# Patient Record
Sex: Female | Born: 1965 | Race: Black or African American | Hispanic: No | Marital: Single | Smoking: Never smoker
Health system: Southern US, Community
[De-identification: ages and names within clinical notes are randomized; demographics above are authoritative.]

## PROBLEM LIST (undated history)

## (undated) DIAGNOSIS — M4800 Spinal stenosis, site unspecified: Secondary | ICD-10-CM

## (undated) HISTORY — PX: REPLACEMENT TOTAL KNEE: SUR1224

## (undated) HISTORY — PX: ABDOMINAL HYSTERECTOMY: SHX81

## (undated) HISTORY — DX: Spinal stenosis, site unspecified: M48.00

---

## 1998-02-09 ENCOUNTER — Encounter: Admission: RE | Admit: 1998-02-09 | Discharge: 1998-02-09 | Payer: Self-pay | Admitting: *Deleted

## 1999-09-05 ENCOUNTER — Other Ambulatory Visit: Admission: RE | Admit: 1999-09-05 | Discharge: 1999-09-05 | Payer: Self-pay | Admitting: *Deleted

## 2002-01-29 ENCOUNTER — Other Ambulatory Visit: Admission: RE | Admit: 2002-01-29 | Discharge: 2002-01-29 | Payer: Self-pay | Admitting: Obstetrics and Gynecology

## 2002-01-30 ENCOUNTER — Encounter: Payer: Self-pay | Admitting: Obstetrics and Gynecology

## 2002-01-30 ENCOUNTER — Encounter: Admission: RE | Admit: 2002-01-30 | Discharge: 2002-01-30 | Payer: Self-pay | Admitting: Obstetrics and Gynecology

## 2002-06-25 ENCOUNTER — Emergency Department (HOSPITAL_COMMUNITY): Admission: EM | Admit: 2002-06-25 | Discharge: 2002-06-25 | Payer: Self-pay | Admitting: Emergency Medicine

## 2002-10-10 ENCOUNTER — Other Ambulatory Visit: Admission: RE | Admit: 2002-10-10 | Discharge: 2002-10-10 | Payer: Self-pay | Admitting: Family Medicine

## 2002-10-20 ENCOUNTER — Other Ambulatory Visit: Admission: RE | Admit: 2002-10-20 | Discharge: 2002-10-20 | Payer: Self-pay | Admitting: Obstetrics

## 2002-12-08 ENCOUNTER — Inpatient Hospital Stay (HOSPITAL_COMMUNITY): Admission: AD | Admit: 2002-12-08 | Discharge: 2002-12-11 | Payer: Self-pay | Admitting: Obstetrics

## 2002-12-08 ENCOUNTER — Encounter (INDEPENDENT_AMBULATORY_CARE_PROVIDER_SITE_OTHER): Payer: Self-pay

## 2003-11-18 ENCOUNTER — Emergency Department (HOSPITAL_COMMUNITY): Admission: EM | Admit: 2003-11-18 | Discharge: 2003-11-18 | Payer: Self-pay | Admitting: Emergency Medicine

## 2004-08-06 ENCOUNTER — Encounter: Admission: RE | Admit: 2004-08-06 | Discharge: 2004-08-06 | Payer: Self-pay | Admitting: Orthopedic Surgery

## 2004-09-14 ENCOUNTER — Other Ambulatory Visit: Admission: RE | Admit: 2004-09-14 | Discharge: 2004-09-14 | Payer: Self-pay | Admitting: Family Medicine

## 2006-12-13 ENCOUNTER — Emergency Department (HOSPITAL_COMMUNITY): Admission: EM | Admit: 2006-12-13 | Discharge: 2006-12-13 | Payer: Self-pay | Admitting: Emergency Medicine

## 2007-04-25 ENCOUNTER — Other Ambulatory Visit: Admission: RE | Admit: 2007-04-25 | Discharge: 2007-04-25 | Payer: Self-pay | Admitting: Obstetrics and Gynecology

## 2007-10-28 ENCOUNTER — Emergency Department (HOSPITAL_BASED_OUTPATIENT_CLINIC_OR_DEPARTMENT_OTHER): Admission: EM | Admit: 2007-10-28 | Discharge: 2007-10-28 | Payer: Self-pay | Admitting: Emergency Medicine

## 2008-03-27 HISTORY — PX: SHOULDER SURGERY: SHX246

## 2008-07-02 ENCOUNTER — Encounter: Admission: RE | Admit: 2008-07-02 | Discharge: 2008-07-02 | Payer: Self-pay | Admitting: Internal Medicine

## 2009-02-11 ENCOUNTER — Ambulatory Visit (HOSPITAL_BASED_OUTPATIENT_CLINIC_OR_DEPARTMENT_OTHER): Admission: RE | Admit: 2009-02-11 | Discharge: 2009-02-11 | Payer: Self-pay | Admitting: Orthopedic Surgery

## 2010-01-24 ENCOUNTER — Ambulatory Visit (HOSPITAL_COMMUNITY): Admission: RE | Admit: 2010-01-24 | Discharge: 2010-01-24 | Payer: Self-pay | Admitting: Internal Medicine

## 2010-02-21 ENCOUNTER — Encounter: Admission: RE | Admit: 2010-02-21 | Discharge: 2010-02-21 | Payer: Self-pay | Admitting: Internal Medicine

## 2010-04-17 ENCOUNTER — Encounter: Payer: Self-pay | Admitting: Orthopedic Surgery

## 2010-04-17 ENCOUNTER — Encounter: Payer: Self-pay | Admitting: Internal Medicine

## 2010-06-09 ENCOUNTER — Other Ambulatory Visit: Payer: Self-pay | Admitting: Interventional Radiology

## 2010-06-09 DIAGNOSIS — M79606 Pain in leg, unspecified: Secondary | ICD-10-CM

## 2010-06-22 ENCOUNTER — Other Ambulatory Visit: Payer: Self-pay

## 2010-06-28 ENCOUNTER — Emergency Department (INDEPENDENT_AMBULATORY_CARE_PROVIDER_SITE_OTHER): Payer: Managed Care, Other (non HMO)

## 2010-06-28 ENCOUNTER — Emergency Department (HOSPITAL_BASED_OUTPATIENT_CLINIC_OR_DEPARTMENT_OTHER)
Admission: EM | Admit: 2010-06-28 | Discharge: 2010-06-28 | Disposition: A | Discharge status: Home / Self Care | Payer: Managed Care, Other (non HMO) | Attending: Emergency Medicine | Admitting: Emergency Medicine

## 2010-06-28 DIAGNOSIS — M79609 Pain in unspecified limb: Secondary | ICD-10-CM

## 2010-06-28 DIAGNOSIS — M1610 Unilateral primary osteoarthritis, unspecified hip: Secondary | ICD-10-CM | POA: Insufficient documentation

## 2010-06-28 DIAGNOSIS — M169 Osteoarthritis of hip, unspecified: Secondary | ICD-10-CM

## 2010-06-28 DIAGNOSIS — Y93B9 Activity, other involving muscle strengthening exercises: Secondary | ICD-10-CM

## 2010-06-28 DIAGNOSIS — M1710 Unilateral primary osteoarthritis, unspecified knee: Secondary | ICD-10-CM

## 2010-06-28 DIAGNOSIS — G8929 Other chronic pain: Secondary | ICD-10-CM | POA: Insufficient documentation

## 2010-06-28 DIAGNOSIS — M25579 Pain in unspecified ankle and joints of unspecified foot: Secondary | ICD-10-CM

## 2010-06-28 DIAGNOSIS — M25569 Pain in unspecified knee: Secondary | ICD-10-CM

## 2010-06-28 DIAGNOSIS — M25559 Pain in unspecified hip: Secondary | ICD-10-CM

## 2010-06-28 LAB — D-DIMER, QUANTITATIVE: D-Dimer, Quant: <0.22 ug/mL-FEU (ref 0.00–0.48)

## 2010-08-12 NOTE — Discharge Summary (Signed)
NAME:  Gabriella Larson, Gabriella Larson                           ACCOUNT NO.:  [id number]   MEDICAL RECORD NO.:  [id number]                   PATIENT TYPE:  INP   LOCATION:  9143                                 FACILITY:  [facility]   PHYSICIAN:  Roseanna Rainbow, M.D.         DATE OF BIRTH:  12-27-1965   DATE OF ADMISSION:  12/08/2002  DATE OF DISCHARGE:  12/11/2002                                 DISCHARGE SUMMARY   CHIEF COMPLAINT:  The patient is a 45 year old gravida 3, para 2 with  symptomatic uterine fibroids who presents for total abdominal hysterectomy.   HISTORY OF PRESENT ILLNESS:  The patient reports worsening dysmenorrhea and  menorrhagia.   ALLERGIES:  No known drug allergies.   MEDICATIONS:  1. Advil.  2. Flagyl.   PAST SURGICAL HISTORY:  1. Herniorrhaphy.  2. Cesarean delivery with BTL.   ILLNESS/PAST MEDICAL HISTORY:  She denies.   FAMILY HISTORY:  Remarkable for breast cancer, ovarian cancer, cervical  cancer, diabetes mellitus.   PHYSICAL EXAMINATION:  VITAL SIGNS:  Temperature 99.1, pulse 76, blood  pressure 122/76, weight 193 pounds, height 5 feet 6 inches.  GENERAL APPEARANCE:  Well-nourished, well-developed African-American female  in no apparent distress.  HEAD/EYES/EARS/NOSE AND THROAT:  Normocephalic, atraumatic.  NECK:  Supple.  LUNGS:  Clear to auscultation bilaterally.  HEART:  Regular rate and rhythm.  BREASTS:  Soft, nontender.  No masses.  ABDOMEN:  Obese, soft, nontender.  No masses felt.  ADENOPATHY:  None.  GENITALIA:  On pelvic examination external female genitalia normal  appearing.  Speculum exam of vagina is clean.  Cervix is parous without  lesions noted.  On bimanual exam the uterus is approximately 16 weeks  aggregate size, irregular contour, tender.  The adnexa are nonpalpable.  EXTREMITIES:  No clubbing, cyanosis, or edema.   ASSESSMENT:  Symptomatic uterine fibroids.   PLAN:  Total abdominal hysterectomy.   HOSPITAL COURSE:  The patient  was admitted and underwent the total abdominal  hysterectomy and bilateral salpingectomy.  Please see the dictated operative  summary for further details.  Her postoperative course was remarkable for  postop day #1 hemoglobin of 9.2, she had a fever with a T-max of 102 within  the first 24 hours; that was unsustained.  She was discharged to home on  postoperative day #3 afebrile and tolerating a regular diet.   DISCHARGE DIAGNOSIS:  Symptomatic uterine fibroids.   PROCEDURE:  Total abdominal hysterectomy.   CONDITION:  Stable.   DIET:  Regular.   ACTIVITY:  No strenuous activity.  No intercourse.   MEDICATIONS INCLUDED:  Percocet one to two tabs every 6 hours as needed,  multivitamin once a day, ferrous sulfate one tab once a day, and the patient  was counseled she could take her own ibuprofen.   DISPOSITION:  She was to follow up in the office in 1 week.  Roseanna Rainbow, M.D.    Judee Clara  D:  12/31/2002  T:  12/31/2002  Job:  [id number]

## 2010-08-12 NOTE — Op Note (Signed)
NAME:  Gabriella Larson, Gabriella Larson                           ACCOUNT NO.:  [id number]   MEDICAL RECORD NO.:  [id number]                   PATIENT TYPE:  INP   LOCATION:  9199                                 FACILITY:  [facility]   PHYSICIAN:  Charles A. Clearance Coots, M.D.             DATE OF BIRTH:  29-Mar-1965   DATE OF PROCEDURE:  12/08/2002  DATE OF DISCHARGE:                                 OPERATIVE REPORT   PREOPERATIVE DIAGNOSIS:  Symptomatic uterine fibroids.   POSTOPERATIVE DIAGNOSIS:  Symptomatic uterine fibroids.   PROCEDURE:  Total abdominal hysterectomy, bilateral salpingectomy.   SURGEONS:  Charles A. Clearance Coots, M.D., Roseanna Rainbow, M.D.   ANESTHESIA:  General.   ESTIMATED BLOOD LOSS:  150 mL.   COMPLICATIONS:  None.   Foley to gravity.   SPECIMENS:  Uterus with cervix and two fallopian tubes.   FINDINGS:  Multiple uterine fibroids.   OPERATION:  The patient was brought to the operating room and after  satisfactory general anesthesia, the abdomen was prepped and draped in the  usual sterile fashion.  A Pfannenstiel skin incision was made with a scalpel  that was deepened down to the fascia with the scalpel.  The fascia was  nicked in the midline and the fascial incision was extended to the left and  to the right with curved Mayo scissors.  The superior and inferior fascial  edges were taken off of the rectus muscles with both blunt and sharp  dissection.  The rectus muscle was then bluntly and sharply divided in the  midline superiorly and inferiorly.  The peritoneum was entered digitally.  It was sharply incised both superiorly and inferiorly, being careful to  avoid the urinary bladder inferiorly.  A self-retaining O'Connor-O'Sullivan  retractor was then placed in the incision.  The bowel was packed off and the  anterior and posterior blades were applied.  The long Kelly forceps were  then placed across the utero-ovarian and broad ligaments.  The round  ligaments were then  transected bilaterally with the Bovie and the anterior  visceral peritoneum, the bladder reflection was then transected off of the  cervix and was pushed down and away from the lower uterine segment and  cervix until the shiny white tissue could be visualized, representing the  cervix and upper vagina.  The defect was then made digitally in the broad  ligament medial and close to the lateral wall of the uterus.  Parametrial  clamps were then placed across the utero-ovarian and broad ligaments in a  double clamp fashion and the utero-ovarian and broad ligaments were then  transected and suture ligated bilaterally with transfixion sutures of 0  Vicryl.  The uterine vessels were then skeletonized bilaterally, transected,  and suture ligated with transfixion suture of 0 Vicryl bilaterally.  Hemostasis was excellent.  The cardinal ligaments were then bilaterally  clamped, transected, and suture ligated with transfixion sutures of 0 Vicryl  down to the uterosacral ligaments.  The uterosacral ligaments were then  clamped bilaterally, transected, and suture ligated with transfixion sutures  of 0 Vicryl.  The parametrial clamps were then placed across the vagina  bilaterally and the cervix along with the uterus was then excised and  submitted to pathology for evaluation.  Corner sutures were placed beneath  the clamp, transfixion sutures of 0 Vicryl, and the remainder of the vaginal  cuff was closed with interrupted sutures of 0 Vicryl.  Hemostasis was  excellent.  The pelvic cavity was thoroughly irrigated with warm saline  solution and all clots were removed.  The pedicles were again observed for  hemostasis, and there was no active bleeding noted.  The fallopian tubes  were then clamped at the proximal end, the isthmic area of the tube, and the  remainder of the fallopian tube was transected and submitted to pathology  for evaluation.  A free tie of 0 Vicryl was placed beneath the Kelly clamp.   The same procedure was performed on the opposite side without complications.  The pelvic cavity was then again thoroughly lavaged with warm saline  solution, and there was no active bleeding noted from any pedicles or  closure of the vaginal cuff.  The self-retaining retractor was then removed  and all packing was removed.  The surgical technician indicated that all  needle, sponge, and instrument counts were correct.  The abdomen was then  closed as follows:  Fascia was closed with a continuous suture of 0 PDS from  each corner to the center, the subcutaneous tissue was thoroughly irrigated  with warm saline solution.  All areas of subcutaneous bleeding were  coagulated with the Bovie.  The skin was then closed with a continuous  subcuticular suture of 3-0 Monocryl.  Sterile bandage was applied to the  incision closure.  The surgical technician indicated that all needle,  sponge, and instrument counts were correct.  The patient tolerated the  procedure well, was transported to the recovery room in satisfactory  condition.                                               Charles A. Clearance Coots, M.D.    CAH/MEDQ  D:  12/08/2002  T:  12/08/2002  Job:  [id number]

## 2010-12-23 LAB — DIFFERENTIAL
Basophils Relative: 0
Eosinophils Absolute: 0
Eosinophils Relative: 1
Lymphocytes Relative: 30
Monocytes Absolute: 0.5
Neutrophils Relative %: 56

## 2010-12-23 LAB — PROTIME-INR: Prothrombin Time: 13.7

## 2010-12-23 LAB — CBC
Hemoglobin: 13.1
WBC: 4

## 2011-01-05 LAB — DIFFERENTIAL
Lymphocytes Relative: 33
Lymphs Abs: 1.6
Neutro Abs: 2.7
Neutrophils Relative %: 57

## 2011-01-05 LAB — URINALYSIS, ROUTINE W REFLEX MICROSCOPIC
Bilirubin Urine: NEGATIVE
Glucose, UA: NEGATIVE
Hgb urine dipstick: NEGATIVE
Ketones, ur: NEGATIVE
Protein, ur: NEGATIVE
Urobilinogen, UA: 1

## 2011-01-05 LAB — CBC
MCHC: 33.8
MCV: 93.5
RBC: 4.17
RDW: 12.6

## 2011-01-05 LAB — POCT PREGNANCY, URINE: Operator id: 27011

## 2011-01-05 LAB — I-STAT 8, (EC8 V) (CONVERTED LAB)
BUN: 12
Chloride: 106
Glucose, Bld: 95
pCO2, Ven: 36.3 — ABNORMAL LOW
pH, Ven: 7.438 — ABNORMAL HIGH

## 2011-01-05 LAB — URINE MICROSCOPIC-ADD ON

## 2011-01-05 LAB — HEPATIC FUNCTION PANEL
ALT: 13
AST: 13
Albumin: 3.9
Alkaline Phosphatase: 49
Total Protein: 7.2

## 2011-05-01 ENCOUNTER — Other Ambulatory Visit: Payer: Self-pay | Admitting: Internal Medicine

## 2011-05-01 DIAGNOSIS — Z1231 Encounter for screening mammogram for malignant neoplasm of breast: Secondary | ICD-10-CM

## 2011-05-05 ENCOUNTER — Ambulatory Visit: Payer: Managed Care, Other (non HMO)

## 2011-05-16 ENCOUNTER — Ambulatory Visit
Admission: RE | Admit: 2011-05-16 | Discharge: 2011-05-16 | Disposition: A | Discharge status: Home / Self Care | Payer: Managed Care, Other (non HMO) | Source: Ambulatory Visit | Attending: Internal Medicine | Admitting: Internal Medicine

## 2011-05-16 DIAGNOSIS — Z1231 Encounter for screening mammogram for malignant neoplasm of breast: Secondary | ICD-10-CM

## 2011-05-29 ENCOUNTER — Ambulatory Visit (INDEPENDENT_AMBULATORY_CARE_PROVIDER_SITE_OTHER): Payer: Managed Care, Other (non HMO) | Admitting: Family Medicine

## 2011-05-29 ENCOUNTER — Encounter: Payer: Self-pay | Admitting: Family Medicine

## 2011-05-29 VITALS — BP 126/80 | HR 105 | Temp 99.0°F | Ht 67.0 in | Wt 190.0 lb

## 2011-05-29 DIAGNOSIS — M545 Low back pain, unspecified: Secondary | ICD-10-CM

## 2011-05-29 MED ORDER — NORTRIPTYLINE HCL 25 MG PO CAPS: 25.0000 mg | ORAL_CAPSULE | Freq: Every day | ORAL | Status: DC

## 2011-05-29 NOTE — Patient Instructions (Signed)
You have spinal stenosis. Your MRI was from 2006 so it has likely progressed since that time. Conservative treatment for this involves: 1. Physical therapy (flexion based exercises) 2. Anti-inflammatories (aleve 2 tabs twice a day with food) 3. Tylenol as needed for pain 4. Consider epidural steroid injection series 5. Adjunctive nerve-blocking medications (nortriptyline, neurontin, lyrica). When these have failed, surgery should be considered (many folks are happy with their functional level following this). If you want to try either injections or surgery, we will need to repeat your MRI. A brace can help with support but doesn't help with long term pain relief.

## 2011-05-30 ENCOUNTER — Encounter: Payer: Self-pay | Admitting: Family Medicine

## 2011-05-30 DIAGNOSIS — M545 Low back pain: Secondary | ICD-10-CM | POA: Insufficient documentation

## 2011-05-30 NOTE — Assessment & Plan Note (Signed)
2/2 at least moderate spinal stenosis.  MRI was done 7 years ago and likely has had progression of this since that time.  Her symptoms have not responded to this point with conservative therapy (including exercises, medications, chiropractic care, ESIs).  Advised we move forward with repeat MRI.  We discussed trying conservative measures again - showed her home flexion based program, will start nortriptyline.  She thinks she would like to speak with surgeon as following step though.  Will discuss this following MRI.

## 2011-05-30 NOTE — Progress Notes (Addendum)
Subjective:    Patient ID: Gabriella Larson, female    DOB: 01/22/1966, 46 y.o.   MRN: [id number]  PCP: Dr. Kathrynn Speed  HPI 46 y.o. F here for low back pain.  Patient denies known injury. States she has had lower back pain off and on for the past 10 years. Over time this has become more persistent and severe. In 2006 saw Dr. Darrelyn Hillock who did an MRI which showed multilevel spinal stenosis worst at L3-4 level. It was recommended that she have surgery but she was scared and decided she would rather deal with the pain. Since then has continued to struggle. Has tried ESIs, chiropractic care, home exercises, nsaids. Functionally not happy with where she is and thinks she is ready for surgical intervention if that is necessary. She gets pain down into right leg past knee with numbness, swelling as well in this distribution. No bowel/bladder dysfunction. Pain improved with slight flexion of back, worse with full flexion and extension.  Past Medical History  Diagnosis Date  . Spinal stenosis     No current outpatient prescriptions on file prior to visit.    Past Surgical History  Procedure Date  . Shoulder surgery 2010    right rotator cuff    No Known Allergies  History   Social History  . Marital Status: Single    Spouse Name: N/A    Number of Children: N/A  . Years of Education: N/A   Occupational History  . Not on file.   Social History Main Topics  . Smoking status: Never Smoker   . Smokeless tobacco: Not on file  . Alcohol Use: Not on file  . Drug Use: Not on file  . Sexually Active: Not on file   Other Topics Concern  . Not on file   Social History Narrative  . No narrative on file    Family History  Problem Relation Age of Onset  . Diabetes Mother   . Hyperlipidemia Mother   . Hypertension Mother   . Heart attack Neg Hx   . Sudden death Neg Hx     BP 126/80  Pulse 105  Temp(Src) 99 F (37.2 C) (Oral)  Ht 5\' 7"  (1.702 m)  Wt 190 lb (86.183 kg)  BMI  29.76 kg/m2  Review of Systems See HPI above.    Objective:   Physical Exam Gen: NAD  Back: No gross deformity, scoliosis. TTP R > L paraspinal muscles lumbar spine.  No bony TTP. FROM with pain on extension and full flexion Strength 5-/5 with right hip flexion, otherwise 5/5 all muscle groups BLEs.   2+ MSRs in patellar and achilles tendons, equal bilaterally. Negative SLRs. Sensation intact to light touch bilaterally. Negative logroll bilateral hips Negative fabers.    Assessment & Plan:  1. Low back pain - 2/2 at least moderate spinal stenosis.  MRI was done 7 years ago and likely has had progression of this since that time.  Her symptoms have not responded to this point with conservative therapy (including exercises, medications, chiropractic care, ESIs).  Advised we move forward with repeat MRI.  We discussed trying conservative measures again - showed her home flexion based program, will start nortriptyline.  She thinks she would like to speak with surgeon as following step though.  Will discuss this following MRI. You have spinal stenosis. Your MRI was from 2006 so it has likely progressed since that time. Conservative treatment for this involves: 1. Physical therapy (flexion based exercises) 2. Anti-inflammatories (aleve  2 tabs twice a day with food) 3. Tylenol as needed for pain 4. Consider epidural steroid injection series 5. Adjunctive nerve-blocking medications (nortriptyline, neurontin, lyrica). When these have failed, surgery should be considered (many folks are happy with their functional level following this). If you want to try either injections or surgery, we will need to repeat your MRI. A brace can help with support but doesn't help with long term pain relief.  Addendum 3/12:  Reviewed MRI results with patient - overall not much change from prior MRI in 2006 - language used also suggests she may not have as bad of spinal stenosis as she was felt to have back in  2006.  She has done conservative treatment for a while now including nsaids, PT, injections, tylenol in the past.  Advised we move forward with neurosurgery referral to further discuss her surgical and nonsurgical options - may consider ESIs again vs surgical intervention.  Pain was fairly severe in the few days following her appointment with me

## 2011-06-03 ENCOUNTER — Ambulatory Visit (HOSPITAL_BASED_OUTPATIENT_CLINIC_OR_DEPARTMENT_OTHER)
Admission: RE | Admit: 2011-06-03 | Discharge: 2011-06-03 | Disposition: A | Discharge status: Home / Self Care | Payer: Managed Care, Other (non HMO) | Source: Ambulatory Visit | Attending: Family Medicine | Admitting: Family Medicine

## 2011-06-03 DIAGNOSIS — M545 Low back pain, unspecified: Secondary | ICD-10-CM | POA: Insufficient documentation

## 2011-06-03 DIAGNOSIS — M48061 Spinal stenosis, lumbar region without neurogenic claudication: Secondary | ICD-10-CM

## 2011-06-03 DIAGNOSIS — M79609 Pain in unspecified limb: Secondary | ICD-10-CM

## 2011-06-03 DIAGNOSIS — M5126 Other intervertebral disc displacement, lumbar region: Secondary | ICD-10-CM | POA: Insufficient documentation

## 2011-06-03 DIAGNOSIS — R209 Unspecified disturbances of skin sensation: Secondary | ICD-10-CM | POA: Insufficient documentation

## 2011-06-06 NOTE — Progress Notes (Signed)
Addended by: Lenda Kelp on: 06/06/2011 04:12 PM   Modules accepted: Orders

## 2012-10-14 ENCOUNTER — Other Ambulatory Visit: Payer: Self-pay

## 2012-10-14 DIAGNOSIS — Z1231 Encounter for screening mammogram for malignant neoplasm of breast: Secondary | ICD-10-CM

## 2012-10-21 ENCOUNTER — Ambulatory Visit
Admission: RE | Admit: 2012-10-21 | Discharge: 2012-10-21 | Disposition: A | Discharge status: Home / Self Care | Payer: Managed Care, Other (non HMO) | Source: Ambulatory Visit

## 2012-10-21 DIAGNOSIS — Z1231 Encounter for screening mammogram for malignant neoplasm of breast: Secondary | ICD-10-CM

## 2014-06-17 ENCOUNTER — Other Ambulatory Visit (HOSPITAL_BASED_OUTPATIENT_CLINIC_OR_DEPARTMENT_OTHER): Payer: Self-pay | Admitting: Internal Medicine

## 2014-06-17 DIAGNOSIS — Z1231 Encounter for screening mammogram for malignant neoplasm of breast: Secondary | ICD-10-CM

## 2014-06-23 ENCOUNTER — Ambulatory Visit (HOSPITAL_BASED_OUTPATIENT_CLINIC_OR_DEPARTMENT_OTHER): Payer: Managed Care, Other (non HMO)

## 2014-08-11 ENCOUNTER — Other Ambulatory Visit: Payer: Self-pay

## 2014-08-11 DIAGNOSIS — Z1231 Encounter for screening mammogram for malignant neoplasm of breast: Secondary | ICD-10-CM

## 2014-08-14 ENCOUNTER — Ambulatory Visit
Admission: RE | Admit: 2014-08-14 | Discharge: 2014-08-14 | Disposition: A | Discharge status: Home / Self Care | Payer: 59 | Source: Ambulatory Visit

## 2014-08-14 DIAGNOSIS — Z1231 Encounter for screening mammogram for malignant neoplasm of breast: Secondary | ICD-10-CM

## 2018-10-02 ENCOUNTER — Other Ambulatory Visit: Payer: Self-pay

## 2018-10-02 ENCOUNTER — Emergency Department (HOSPITAL_COMMUNITY): Payer: 59

## 2018-10-02 ENCOUNTER — Encounter (HOSPITAL_COMMUNITY): Payer: Self-pay | Admitting: *Deleted

## 2018-10-02 ENCOUNTER — Emergency Department (HOSPITAL_COMMUNITY)
Admission: EM | Admit: 2018-10-02 | Discharge: 2018-10-03 | Disposition: A | Discharge status: Home / Self Care | Payer: 59 | Attending: Emergency Medicine | Admitting: Emergency Medicine

## 2018-10-02 DIAGNOSIS — R519 Headache, unspecified: Secondary | ICD-10-CM

## 2018-10-02 DIAGNOSIS — R0789 Other chest pain: Secondary | ICD-10-CM | POA: Insufficient documentation

## 2018-10-02 DIAGNOSIS — R51 Headache: Secondary | ICD-10-CM | POA: Insufficient documentation

## 2018-10-02 DIAGNOSIS — R42 Dizziness and giddiness: Secondary | ICD-10-CM | POA: Insufficient documentation

## 2018-10-02 DIAGNOSIS — R079 Chest pain, unspecified: Secondary | ICD-10-CM

## 2018-10-02 LAB — CBG MONITORING, ED: Glucose-Capillary: 109 mg/dL — ABNORMAL HIGH (ref 70–99)

## 2018-10-02 LAB — CBC
HCT: 41.3 % (ref 36.0–46.0)
Hemoglobin: 13.6 g/dL (ref 12.0–15.0)
MCH: 30.6 pg (ref 26.0–34.0)
MCHC: 32.9 g/dL (ref 30.0–36.0)
MCV: 93 fL (ref 80.0–100.0)
Platelets: 315 10*3/uL (ref 150–400)
RBC: 4.44 MIL/uL (ref 3.87–5.11)
RDW: 11.9 % (ref 11.5–15.5)
WBC: 4.6 10*3/uL (ref 4.0–10.5)
nRBC: 0 % (ref 0.0–0.2)

## 2018-10-02 LAB — DIFFERENTIAL
Abs Immature Granulocytes: 0.02 10*3/uL (ref 0.00–0.07)
Basophils Absolute: 0 10*3/uL (ref 0.0–0.1)
Basophils Relative: 0 %
Eosinophils Absolute: 0.1 10*3/uL (ref 0.0–0.5)
Eosinophils Relative: 2 %
Immature Granulocytes: 0 %
Lymphocytes Relative: 47 %
Lymphs Abs: 2.2 10*3/uL (ref 0.7–4.0)
Monocytes Absolute: 0.4 10*3/uL (ref 0.1–1.0)
Monocytes Relative: 8 %
Neutro Abs: 2 10*3/uL (ref 1.7–7.7)
Neutrophils Relative %: 43 %

## 2018-10-02 LAB — PROTIME-INR
INR: 1.1 (ref 0.8–1.2)
Prothrombin Time: 13.9 seconds (ref 11.4–15.2)

## 2018-10-02 LAB — APTT: aPTT: 27 seconds (ref 24–36)

## 2018-10-02 LAB — COMPREHENSIVE METABOLIC PANEL
ALT: 21 U/L (ref 0–44)
AST: 15 U/L (ref 15–41)
Albumin: 4.2 g/dL (ref 3.5–5.0)
Alkaline Phosphatase: 82 U/L (ref 38–126)
Anion gap: 10 (ref 5–15)
BUN: 15 mg/dL (ref 6–20)
CO2: 25 mmol/L (ref 22–32)
Calcium: 9.5 mg/dL (ref 8.9–10.3)
Chloride: 105 mmol/L (ref 98–111)
Creatinine, Ser: 0.95 mg/dL (ref 0.44–1.00)
GFR calc Af Amer: >60 mL/min (ref >60)
GFR calc non Af Amer: >60 mL/min (ref >60)
Glucose, Bld: 103 mg/dL — ABNORMAL HIGH (ref 70–99)
Potassium: 3.9 mmol/L (ref 3.5–5.1)
Sodium: 140 mmol/L (ref 135–145)
Total Bilirubin: 0.6 mg/dL (ref 0.3–1.2)
Total Protein: 7.7 g/dL (ref 6.5–8.1)

## 2018-10-02 LAB — I-STAT BETA HCG BLOOD, ED (MC, WL, AP ONLY): I-stat hCG, quantitative: <5 m[IU]/mL (ref <5)

## 2018-10-02 LAB — I-STAT CHEM 8, ED
BUN: 16 mg/dL (ref 6–20)
Calcium, Ion: 1.18 mmol/L (ref 1.15–1.40)
Chloride: 106 mmol/L (ref 98–111)
Creatinine, Ser: 0.9 mg/dL (ref 0.44–1.00)
Glucose, Bld: 99 mg/dL (ref 70–99)
HCT: 41 % (ref 36.0–46.0)
Hemoglobin: 13.9 g/dL (ref 12.0–15.0)
Potassium: 3.8 mmol/L (ref 3.5–5.1)
Sodium: 139 mmol/L (ref 135–145)
TCO2: 26 mmol/L (ref 22–32)

## 2018-10-02 MED ORDER — SODIUM CHLORIDE 0.9% FLUSH: 3.0000 mL | Freq: Once | INTRAVENOUS | Status: DC

## 2018-10-02 MED ORDER — ASPIRIN 325 MG PO TABS
325.0000 mg | ORAL_TABLET | Freq: Every day | ORAL | Status: DC
  Administered 2018-10-02: 325 mg via ORAL
  Filled 2018-10-02: qty 1

## 2018-10-02 NOTE — ED Triage Notes (Addendum)
PT states R sided chest pain, dizziness with exertion, R frontal headache, blurred vision.  Pt states slow (not slurred) speech started today around 1530, but pt speaking clearly now.  Symptoms were intermittent for a week, but increased today.  Neuro intact.

## 2018-10-02 NOTE — ED Notes (Signed)
The pt  Is c/o chest pain for several days no hx..  Some pain at present

## 2018-10-02 NOTE — ED Provider Notes (Signed)
[facility]   CSN: [id number] Arrival date & time: 10/02/18  1641     History   Chief Complaint Chief Complaint  Patient presents with  . Chest Pain  . Dizziness    HPI Gabriella Larson is a 53 y.o. female.     The history is provided by the patient and medical records.  Chest Pain Associated symptoms: dizziness   Dizziness Associated symptoms: chest pain      53 y.o. F with hx of spinal stenosis, presenting to the ED for multiple complaints.  Patient reports for the past week she has been having intermittent episodes of right sided chest pain and dizziness.  Chest pain described as a right-sided sharp, stabbing pain lasting just a few seconds.  This is not worsened with exertion.  She is not had any associated shortness of breath, diaphoresis, nausea, or vomiting.  Dizziness is described as lightheadedness and feeling like she is going to pass out-- dark spots in the vision, etc.  No room spinning sensations.  She does notice this more when she stands up from sitting or after going up and down the stairs.  She has not had any syncopal episodes.  States today symptoms were occurring more frequently and she got a headache across her forehead which concerned her.  States at one point she felt like she was talking slow but is not entirely sure.  States currently her speech feels normal.  She is not had any numbness or weakness of her arms or legs.  No hx of TIA or stroke.  She did not take anything for her head yet, she can only take ASA due to allergies to other OTC agents.  No sick contacts or known COVID exposures.  Past Medical History:  Diagnosis Date  . Spinal stenosis     Patient Active Problem List   Diagnosis Date Noted  . Low back pain 05/30/2011    Past Surgical History:  Procedure Laterality Date  . ABDOMINAL HYSTERECTOMY    . SHOULDER SURGERY  2010   right rotator cuff     OB History   No obstetric history on  file.      Home Medications    Prior to Admission medications   Medication Sig Start Date End Date Taking? Authorizing Provider  nortriptyline (PAMELOR) 25 MG capsule Take 1 capsule (25 mg total) by mouth at bedtime. 05/29/11 05/28/12  Lenda KelpHudnall, Shane R, MD    Family History Family History  Problem Relation Age of Onset  . Diabetes Mother   . Hyperlipidemia Mother   . Hypertension Mother   . Heart attack Neg Hx   . Sudden death Neg Hx     Social History Social History   Tobacco Use  . Smoking status: Never Smoker  . Smokeless tobacco: Never Used  Substance Use Topics  . Alcohol use: Yes    Comment: occ  . Drug use: Never     Allergies   Acetaminophen, Advil [ibuprofen], and Aleve [naproxen]   Review of Systems Review of Systems  Cardiovascular: Positive for chest pain.  Neurological: Positive for dizziness and light-headedness.  All other systems reviewed and are negative.    Physical Exam Updated Vital Signs BP (!) 142/79 (BP Location: Left Arm)   Pulse 80   Temp 98.3 F (36.8 C) (Oral)   Resp 18   Ht 5\' 7"  (1.702 m)   Wt 108.9 kg   SpO2 100%   BMI 37.59  kg/m   Physical Exam Vitals signs and nursing note reviewed.  Constitutional:      Appearance: She is well-developed.     Comments: Appears well, NAD  HENT:     Head: Normocephalic and atraumatic.  Eyes:     Conjunctiva/sclera: Conjunctivae normal.     Pupils: Pupils are equal, round, and reactive to light.     Comments: EOMs intact, no nystagmus, no field cuts  Neck:     Musculoskeletal: Normal range of motion.  Cardiovascular:     Rate and Rhythm: Normal rate and regular rhythm.     Heart sounds: Normal heart sounds.  Pulmonary:     Effort: Pulmonary effort is normal.     Breath sounds: Normal breath sounds.  Chest:     Comments: Chest wall non-tender Abdominal:     General: Bowel sounds are normal.     Palpations: Abdomen is soft.  Musculoskeletal: Normal range of motion.  Skin:     General: Skin is warm and dry.  Neurological:     Mental Status: She is alert and oriented to person, place, and time.     Comments: AAOx3, answering questions and following commands appropriately; speech is clear and goal oriented; equal strength UE and LE bilaterally; CN grossly intact; moves all extremities appropriately without ataxia; negative pronator drift; no focal neuro deficits or facial asymmetry appreciated      ED Treatments / Results  Labs (all labs ordered are listed, but only abnormal results are displayed) Labs Reviewed  COMPREHENSIVE METABOLIC PANEL - Abnormal; Notable for the following components:      Result Value   Glucose, Bld 103 (*)    All other components within normal limits  CBG MONITORING, ED - Abnormal; Notable for the following components:   Glucose-Capillary 109 (*)    All other components within normal limits  PROTIME-INR  APTT  CBC  DIFFERENTIAL  TROPONIN I (HIGH SENSITIVITY)  I-STAT CHEM 8, ED  I-STAT BETA HCG BLOOD, ED ([facility], [facility], [facility] ONLY)    EKG None  Radiology Ct Head Wo Contrast  Result Date: 10/02/2018 CLINICAL DATA:  Blurred vision, right frontal headache, dizziness on exertion. Transient speech difficulties. EXAM: CT HEAD WITHOUT CONTRAST TECHNIQUE: Contiguous axial images were obtained from the base of the skull through the vertex without intravenous contrast. COMPARISON:  None. FINDINGS: Brain: No evidence of acute infarction, hemorrhage, hydrocephalus, extra-axial collection or mass lesion/mass effect. Vascular: No hyperdense vessel or unexpected calcification. Skull: Normal. Negative for fracture or focal lesion. Sinuses/Orbits: No acute finding. Other: None. IMPRESSION: No acute intracranial abnormality. If persistent clinical concern for infarct, MRI could be obtained. Electronically Signed   By: MD Lovena Le   On: 10/02/2018 18:01    Procedures Procedures (including critical care time)  Medications Ordered in ED Medications   sodium chloride flush (NS) 0.9 % injection 3 mL (has no administration in time range)  aspirin tablet 325 mg (325 mg Oral Given 10/02/18 2348)  sodium chloride 0.9 % bolus 1,000 mL (1,000 mLs Intravenous New Bag/Given 10/03/18 0201)     Initial Impression / Assessment and Plan / ED Course  I have reviewed the triage vital signs and the nursing notes.  Pertinent labs & imaging results that were available during my care of the patient were reviewed by me and considered in my medical decision making (see chart for details).  53 y.o. F here with multiple concerns-- intermittent right sided chest pain and dizziness for the past week and new  headache today.  She is AAOx3 here, no focal deficits noted on my exam.  Her vital signs are stable.  Work-up thus far reassuring including labs and CT of the head.  EKG without acute ischemic changes.  Will add on troponin and chest x-ray.  Given ASA for headache at her request. Her symptoms are not necessarily concerning for ACS, PE, dissection, or other acute cardiac event.  Intermittent dizziness seems more like orthostasis as this is worse with sudden position change, black spots in vision, etc.  Patient essentially has no risk factors for stroke. Will check orthostatics as she does report not drinking water like she should.  Will reassess.  12:34 AM Attempted orthostatics, patient unable to stand due to feeling dizzy like she was going to pass out.  Will give liter of fluids.  Suspect this is etiology of her symptoms.  2:57 AM Patient feeling better after IV fluids.  Her troponin is negative.  She remains neurologically intact here without focal deficit.  At this point, I feel she is stable for discharge home.  She does not currently have PCP nor insurance so will refer to the wellness clinic for follow-up.  Encouraged to continue good oral hydration.  She may return here for any new/acute changes.  Final Clinical Impressions(s) / ED Diagnoses   Final  diagnoses:  Intermittent lightheadedness  Frontal headache  Chest pain in adult    ED Discharge Orders    None       Garlon HatchetSanders, Jerelle Virden M, PA-C 10/03/18 0305    Marily MemosMesner, Jason, MD 10/05/18 [phone]

## 2018-10-02 NOTE — ED Notes (Signed)
Orthostatic vitals done, pt got dizzy once sitting up and was unable to stand due to the dizziness. Pt complains of increased headache and dizziness.

## 2018-10-02 NOTE — ED Notes (Signed)
To x-ray

## 2018-10-03 LAB — TROPONIN I (HIGH SENSITIVITY): Troponin I (High Sensitivity): 3 ng/L (ref <18)

## 2018-10-03 MED ORDER — SODIUM CHLORIDE 0.9 % IV BOLUS
1000.0000 mL | Freq: Once | INTRAVENOUS | Status: AC
  Administered 2018-10-03: 1000 mL via INTRAVENOUS

## 2018-10-03 NOTE — Discharge Instructions (Signed)
Your work-up today was overall reassuring.  Make sure to continue good oral hydration as this should help with the lightheadedness. Follow-up with the wellness clinic--recommend to call first thing in the morning to try to get an appointment. Return to the ED for new or worsening symptoms.

## 2019-12-08 ENCOUNTER — Other Ambulatory Visit: Payer: Self-pay

## 2019-12-08 ENCOUNTER — Emergency Department (HOSPITAL_BASED_OUTPATIENT_CLINIC_OR_DEPARTMENT_OTHER)
Admission: EM | Admit: 2019-12-08 | Discharge: 2019-12-08 | Disposition: A | Discharge status: Home / Self Care | Payer: 59 | Attending: Emergency Medicine | Admitting: Emergency Medicine

## 2019-12-08 ENCOUNTER — Encounter (HOSPITAL_BASED_OUTPATIENT_CLINIC_OR_DEPARTMENT_OTHER): Payer: Self-pay | Admitting: *Deleted

## 2019-12-08 ENCOUNTER — Emergency Department (HOSPITAL_BASED_OUTPATIENT_CLINIC_OR_DEPARTMENT_OTHER): Payer: 59

## 2019-12-08 DIAGNOSIS — R1011 Right upper quadrant pain: Secondary | ICD-10-CM | POA: Diagnosis not present

## 2019-12-08 LAB — COMPREHENSIVE METABOLIC PANEL
ALT: 20 U/L (ref 0–44)
AST: 15 U/L (ref 15–41)
Albumin: 4.3 g/dL (ref 3.5–5.0)
Alkaline Phosphatase: 82 U/L (ref 38–126)
Anion gap: 9 (ref 5–15)
BUN: 13 mg/dL (ref 6–20)
CO2: 27 mmol/L (ref 22–32)
Calcium: 9.2 mg/dL (ref 8.9–10.3)
Chloride: 103 mmol/L (ref 98–111)
Creatinine, Ser: 0.8 mg/dL (ref 0.44–1.00)
GFR calc Af Amer: >60 mL/min (ref >60)
GFR calc non Af Amer: >60 mL/min (ref >60)
Glucose, Bld: 96 mg/dL (ref 70–99)
Potassium: 3.9 mmol/L (ref 3.5–5.1)
Sodium: 139 mmol/L (ref 135–145)
Total Bilirubin: 0.5 mg/dL (ref 0.3–1.2)
Total Protein: 7.7 g/dL (ref 6.5–8.1)

## 2019-12-08 LAB — CBC WITH DIFFERENTIAL/PLATELET
Abs Immature Granulocytes: 0.01 10*3/uL (ref 0.00–0.07)
Basophils Absolute: 0 10*3/uL (ref 0.0–0.1)
Basophils Relative: 0 %
Eosinophils Absolute: 0.1 10*3/uL (ref 0.0–0.5)
Eosinophils Relative: 1 %
HCT: 40.1 % (ref 36.0–46.0)
Hemoglobin: 13.1 g/dL (ref 12.0–15.0)
Immature Granulocytes: 0 %
Lymphocytes Relative: 53 %
Lymphs Abs: 1.9 10*3/uL (ref 0.7–4.0)
MCH: 30.5 pg (ref 26.0–34.0)
MCHC: 32.7 g/dL (ref 30.0–36.0)
MCV: 93.5 fL (ref 80.0–100.0)
Monocytes Absolute: 0.4 10*3/uL (ref 0.1–1.0)
Monocytes Relative: 10 %
Neutro Abs: 1.4 10*3/uL — ABNORMAL LOW (ref 1.7–7.7)
Neutrophils Relative %: 36 %
Platelets: 283 10*3/uL (ref 150–400)
RBC: 4.29 MIL/uL (ref 3.87–5.11)
RDW: 12.3 % (ref 11.5–15.5)
WBC: 3.7 10*3/uL — ABNORMAL LOW (ref 4.0–10.5)
nRBC: 0 % (ref 0.0–0.2)

## 2019-12-08 LAB — LIPASE, BLOOD: Lipase: 36 U/L (ref 11–51)

## 2019-12-08 MED ORDER — IOHEXOL 300 MG/ML  SOLN
100.0000 mL | Freq: Once | INTRAMUSCULAR | Status: AC | PRN
  Administered 2019-12-08: 100 mL via INTRAVENOUS

## 2019-12-08 MED ORDER — OXYCODONE HCL 5 MG PO TABS: 5.0000 mg | ORAL_TABLET | ORAL | 0 refills | Status: DC | PRN

## 2019-12-08 MED ORDER — OMEPRAZOLE 20 MG PO CPDR: 20.0000 mg | DELAYED_RELEASE_CAPSULE | Freq: Every day | ORAL | 1 refills | Status: DC

## 2019-12-08 MED ORDER — SUCRALFATE 1 G PO TABS: 1.0000 g | ORAL_TABLET | Freq: Four times a day (QID) | ORAL | 0 refills | Status: AC | PRN

## 2019-12-08 NOTE — ED Notes (Signed)
AVS reviewed with pt, pt teaching done in regards to the 3 medication Rx written by the ED provider, discussed safety when taking oxy IR po tabs. Opportunity for questions provided. Copy of AVS given to pt and informed as to where here 3 Rx were electronically sent to.

## 2019-12-08 NOTE — Discharge Instructions (Addendum)
You were evaluated in the Emergency Department and after careful evaluation, we did not find any emergent condition requiring admission or further testing in the hospital.  Your exam/testing today was overall reassuring.  Your CT scan did not show any emergencies or abnormalities.  As discussed, your symptoms seem best explained by either acid reflux related pain or biliary colic.  Please take the omeprazole medication daily to prevent pain and use the Carafate medication up to 4 times daily for pain.  Can also use the oxycodone medication for more significant pain.  Please discuss this ED visit with your PCP on Friday and discussed the need for gallbladder ultrasound.  Please return to the Emergency Department if you experience any worsening of your condition.  Thank you for allowing [place] to be a part of your care.

## 2019-12-08 NOTE — ED Provider Notes (Signed)
[facility]PT [facility] Emergency Department Provider Note MRN:  [id number]  Arrival date & time: 12/08/19     Chief Complaint   Abdominal Pain   History of Present Illness   Gabriella Larson is a 54 y.o. year-old female with no pertinent past medical history presenting to the ED with chief complaint of abdominal pain.  Abdominal pain since June, intermittent, worse with meals.  Located in the right upper quadrant.  Worse over the past few days, moderate in severity.  Denies nausea or vomiting, no fever, no chest pain, shortness of breath, no diarrhea.  Review of Systems  A complete 10 system review of systems was obtained and all systems are negative except as noted in the HPI and PMH.   Patient's Health History    Past Medical History:  Diagnosis Date  . Spinal stenosis     Past Surgical History:  Procedure Laterality Date  . ABDOMINAL HYSTERECTOMY    . SHOULDER SURGERY  2010   right rotator cuff    Family History  Problem Relation Age of Onset  . Diabetes Mother   . Hyperlipidemia Mother   . Hypertension Mother   . Heart attack Neg Hx   . Sudden death Neg Hx     Social History   Socioeconomic History  . Marital status: Single    Spouse name: Not on file  . Number of children: Not on file  . Years of education: Not on file  . Highest education level: Not on file  Occupational History  . Not on file  Tobacco Use  . Smoking status: Never Smoker  . Smokeless tobacco: Never Used  Vaping Use  . Vaping Use: Never used  Substance and Sexual Activity  . Alcohol use: Yes    Comment: occ  . Drug use: Never  . Sexual activity: Not on file  Other Topics Concern  . Not on file  Social History Narrative  . Not on file   Social Determinants of Health   Financial Resource Strain:   . Difficulty of Paying Living Expenses: Not on file  Food Insecurity:   . Worried About Programme researcher, broadcasting/film/video in the Last Year: Not on file  . Ran Out of Food in the Last  Year: Not on file  Transportation Needs:   . Lack of Transportation (Medical): Not on file  . Lack of Transportation (Non-Medical): Not on file  Physical Activity:   . Days of Exercise per Week: Not on file  . Minutes of Exercise per Session: Not on file  Stress:   . Feeling of Stress : Not on file  Social Connections:   . Frequency of Communication with Friends and Family: Not on file  . Frequency of Social Gatherings with Friends and Family: Not on file  . Attends Religious Services: Not on file  . Active Member of Clubs or Organizations: Not on file  . Attends Banker Meetings: Not on file  . Marital Status: Not on file  Intimate Partner Violence:   . Fear of Current or Ex-Partner: Not on file  . Emotionally Abused: Not on file  . Physically Abused: Not on file  . Sexually Abused: Not on file     Physical Exam   Vitals:   12/08/19 1441  BP: (!) 174/99  Pulse: 73  Resp: 18  Temp: 98.5 F (36.9 C)  SpO2: 100%    CONSTITUTIONAL: Well-appearing, NAD NEURO:  Alert and oriented x 3, no focal deficits  EYES:  eyes equal and reactive ENT/NECK:  no LAD, no JVD CARDIO: Regular rate, well-perfused, normal S1 and S2 PULM:  CTAB no wheezing or rhonchi GI/GU:  normal bowel sounds, non-distended, mild right upper quadrant tenderness to palpation MSK/SPINE:  No gross deformities, no edema SKIN:  no rash, atraumatic PSYCH:  Appropriate speech and behavior  *Additional and/or pertinent findings included in MDM below  Diagnostic and Interventional Summary    EKG Interpretation  Date/Time:    Ventricular Rate:    PR Interval:    QRS Duration:   QT Interval:    QTC Calculation:   R Axis:     Text Interpretation:        Labs Reviewed  CBC WITH DIFFERENTIAL/PLATELET - Abnormal; Notable for the following components:      Result Value   WBC 3.7 (*)    Neutro Abs 1.4 (*)    All other components within normal limits  COMPREHENSIVE METABOLIC PANEL  LIPASE,  BLOOD    CT ABDOMEN PELVIS W CONTRAST  Final Result      Medications  iohexol (OMNIPAQUE) 300 MG/ML solution 100 mL (100 mLs Intravenous Contrast Given 12/08/19 1450)     Procedures  /  Critical Care Procedures  ED Course and Medical Decision Making  I have reviewed the triage vital signs, the nursing notes, and pertinent available records from the EMR.  Listed above are laboratory and imaging tests that I personally ordered, reviewed, and interpreted and then considered in my medical decision making (see below for details).  Favoring GERD versus biliary colic, vital signs are reassuring, no rebound or guarding or rigidity.  No fever.  CT is reassuring with no signs of cholecystitis, otherwise without acute process.  Biliary colic still possible and I would have possibly obtained follow-up ultrasound, however unavailable at this time at [facility].  Patient has good follow-up in 4 days with her PCP, will treat for GERD and advise further discussion of the need for ultrasound with PCP.  Appropriate for discharge.       Elmer Sow. Pilar Plate, MD [facility] Emergency Medicine [facility] [email or web address]  Final Clinical Impressions(s) / ED Diagnoses     ICD-10-CM   1. Right upper quadrant abdominal pain  R10.11     ED Discharge Orders         Ordered    omeprazole (PRILOSEC) 20 MG capsule  Daily        12/08/19 1742    sucralfate (CARAFATE) 1 g tablet  4 times daily PRN        12/08/19 1742    oxyCODONE (ROXICODONE) 5 MG immediate release tablet  Every 4 hours PRN        12/08/19 1742           Discharge Instructions Discussed with and Provided to Patient:     Discharge Instructions     You were evaluated in the Emergency Department and after careful evaluation, we did not find any emergent condition requiring admission or further testing in the hospital.  Your exam/testing today was overall reassuring.  Your CT scan did not show any  emergencies or abnormalities.  As discussed, your symptoms seem best explained by either acid reflux related pain or biliary colic.  Please take the omeprazole medication daily to prevent pain and use the Carafate medication up to 4 times daily for pain.  Can also use the oxycodone medication for more significant pain.  Please discuss this ED visit  with your PCP on Friday and discussed the need for gallbladder ultrasound.  Please return to the Emergency Department if you experience any worsening of your condition.  Thank you for allowing [place] to be a part of your care.       Sabas Sous, MD 12/08/19 [phone]

## 2021-08-26 IMAGING — CT CT ABD-PELV W/ CM
2 of 5 series · 16 of 46 positions shown, 18 images · IV contrast (Omnipaque)
Comparison: None.

CLINICAL DATA: Abdominal pain

EXAM:
CT ABDOMEN AND PELVIS WITH CONTRAST
TECHNIQUE: Multidetector CT imaging of the abdomen and pelvis was performed
using the standard protocol following bolus administration of
intravenous contrast.
CONTRAST:  100mL OMNIPAQUE IOHEXOL 300 MG/ML  SOLN

[Series 2: axial st · axial · 0.98mm/px · z∈[-558,-144]mm · 13 of 93 slices shown, 15 images]
[im 5/93  soft-tissue]
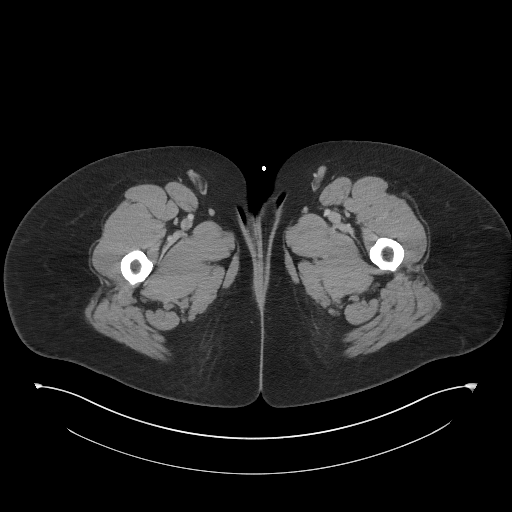
[im 5/93  bone]
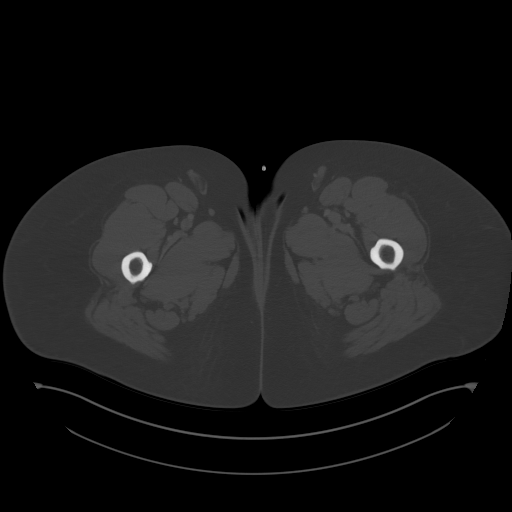
[im 14/93  soft-tissue]
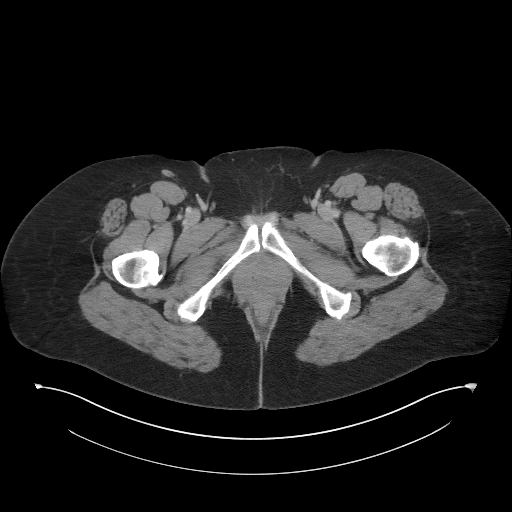
[im 19/93  soft-tissue]
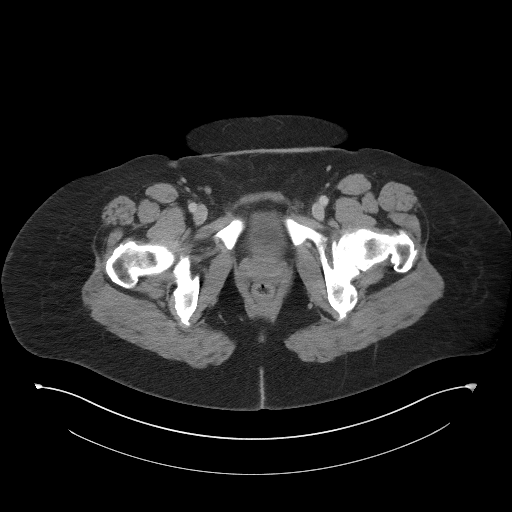
[im 28/93  soft-tissue]
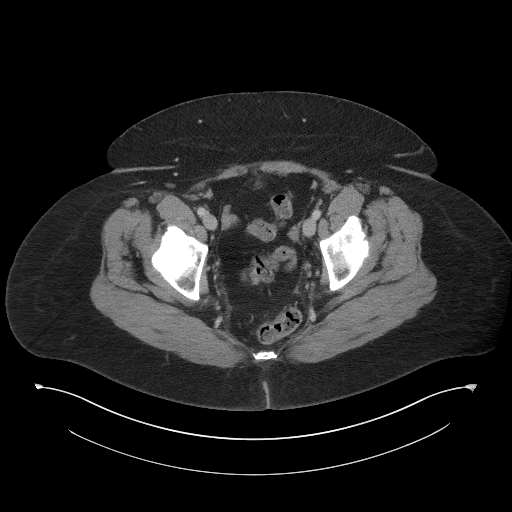
[im 33/93  soft-tissue]
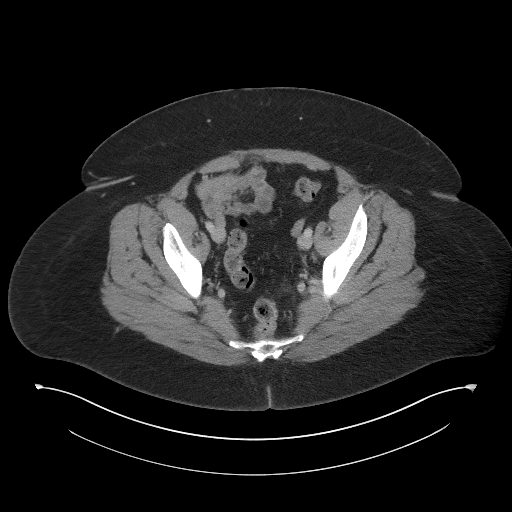
[im 42/93  soft-tissue]
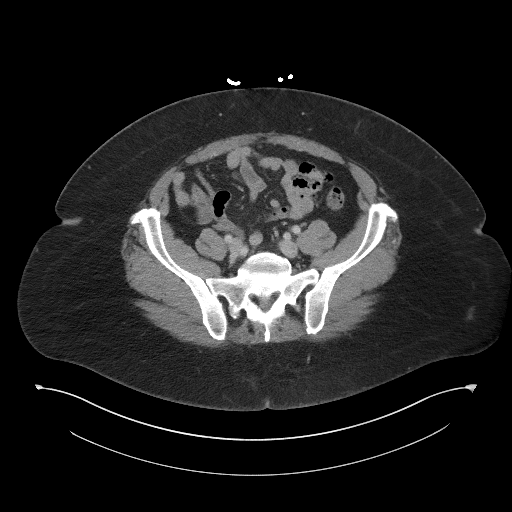
[im 47/93  soft-tissue]
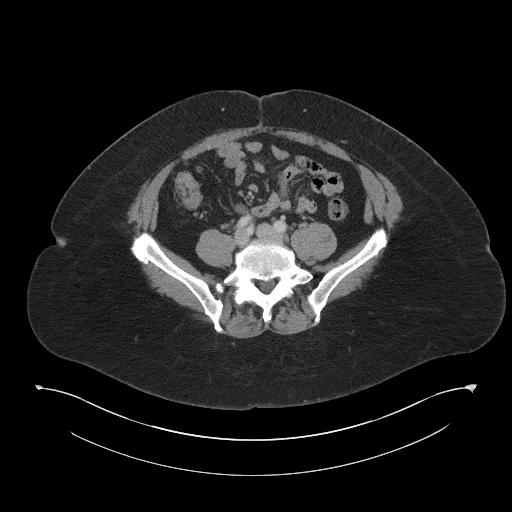
[im 51/93  soft-tissue]
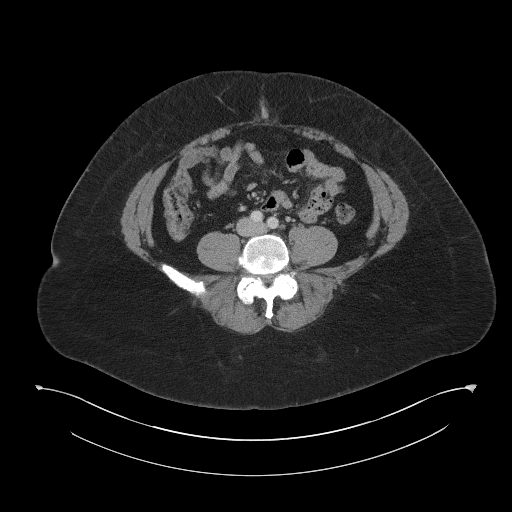
[im 60/93  soft-tissue]
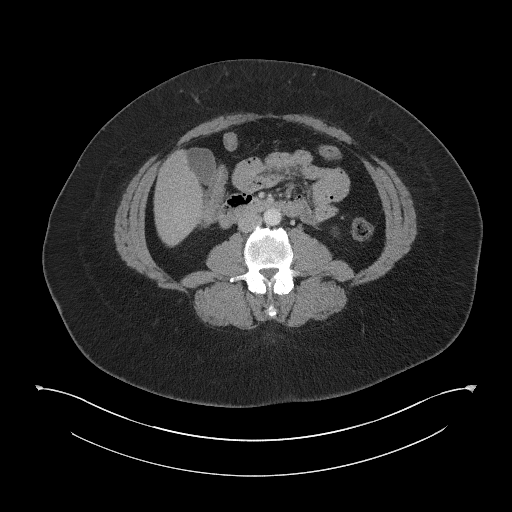
[im 60/93  bone]
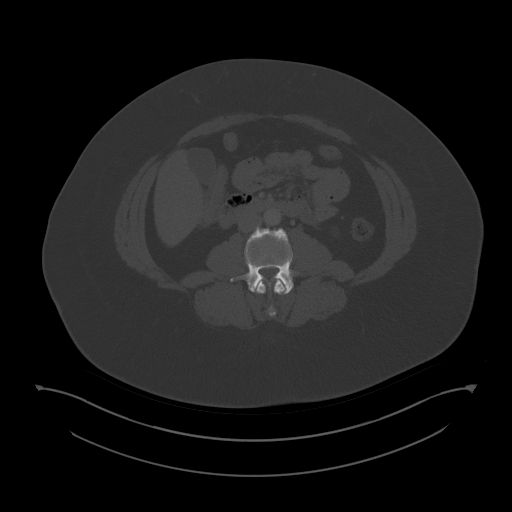
[im 65/93  soft-tissue]
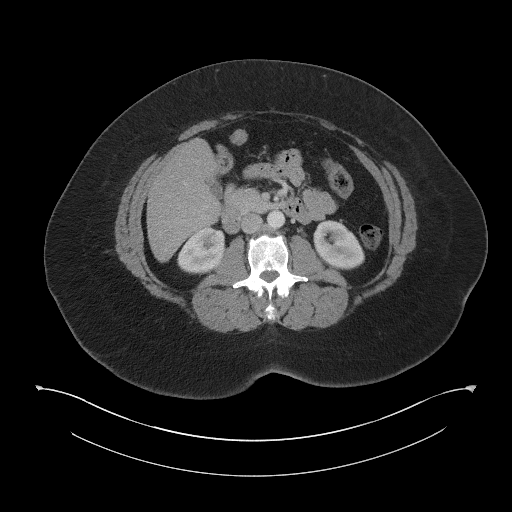
[im 74/93  soft-tissue]
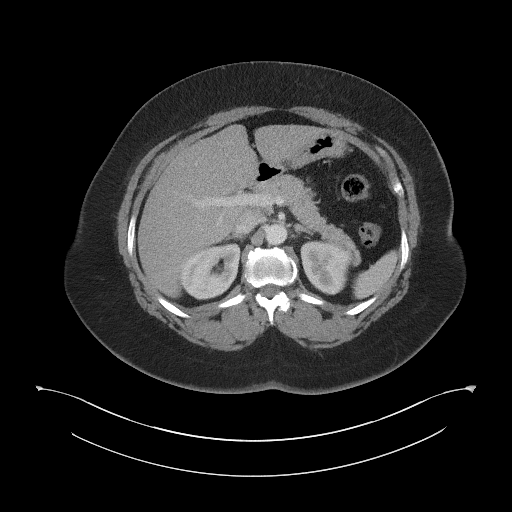
[im 79/93  soft-tissue]
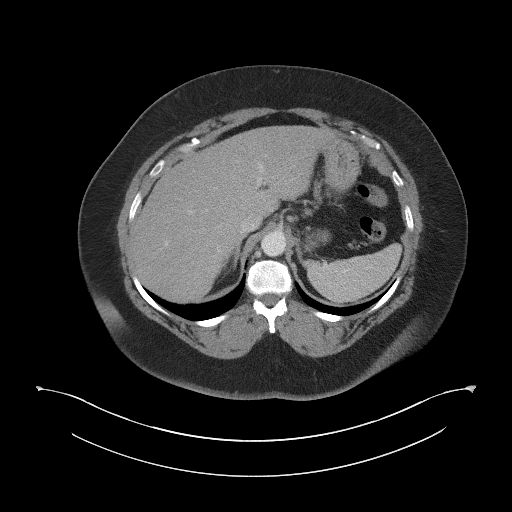
[im 88/93  soft-tissue]
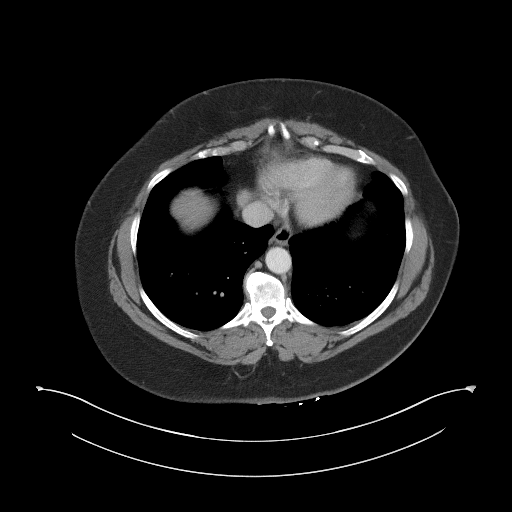

[Series 4: coronal st · coronal · 0.86mm/px · 3 of 115 slices shown]
[im 39/115  soft-tissue]
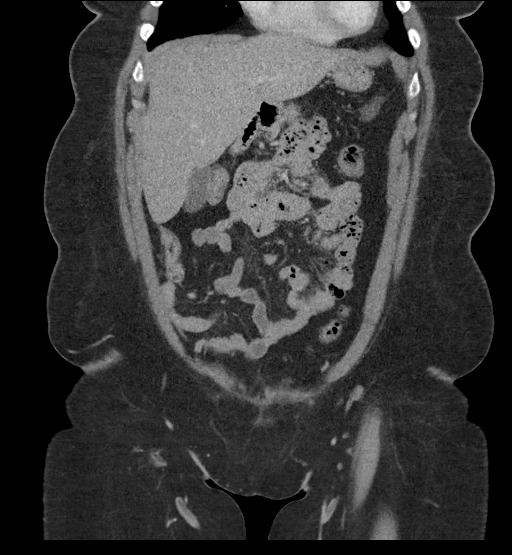
[im 51/115  soft-tissue]
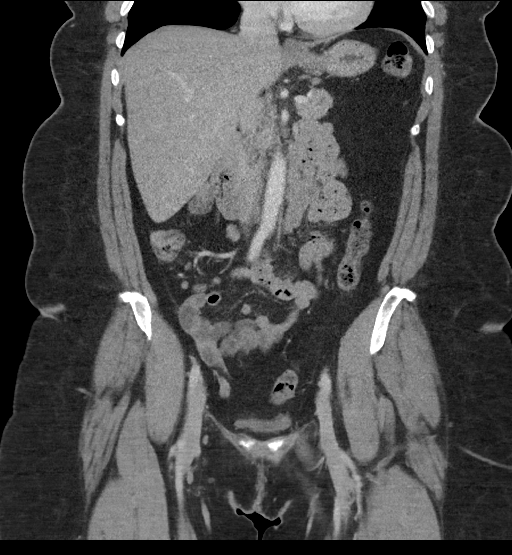
[im 64/115  soft-tissue]
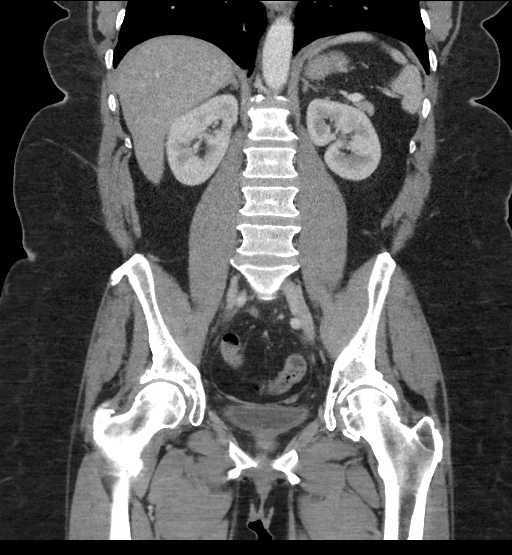

[16 of 46 positions shown; findings below may reference images not displayed]

FINDINGS: Lower chest: The visualized heart size within normal limits. No
pericardial fluid/thickening.

No hiatal hernia.

The visualized portions of the lungs are clear.

Hepatobiliary: There is diffuse low density seen throughout the
liver parenchyma. No focal hepatic lesion is seen. No intra or
extrahepatic biliary ductal dilatation. The main portal vein is
patent. No evidence of calcified gallstones, gallbladder wall
thickening or biliary dilatation.

Pancreas: Unremarkable. No pancreatic ductal dilatation or
surrounding inflammatory changes.

Spleen: Normal in size without focal abnormality.

Adrenals/Urinary Tract: Both adrenal glands appear normal. The
kidneys and collecting system appear normal without evidence of
urinary tract calculus or hydronephrosis. Bladder is unremarkable.

Stomach/Bowel: The stomach and small bowel are normal in appearance.
There is scattered colonic diverticula without diverticulitis.The
appendix is normal.

Vascular/Lymphatic: There are no enlarged mesenteric,
retroperitoneal, or pelvic lymph nodes. No significant vascular
findings are present.

Reproductive: The patient is status post hysterectomy. No adnexal
masses or collections seen.

Other: No evidence of abdominal wall mass or hernia.

Musculoskeletal: No acute or significant osseous findings.
IMPRESSION: No acute intra-abdominal or pelvic pathology to explain the
patient's symptoms.

Normal appearing appendix.

Diverticulosis without diverticulitis.

## 2021-10-24 ENCOUNTER — Other Ambulatory Visit (HOSPITAL_BASED_OUTPATIENT_CLINIC_OR_DEPARTMENT_OTHER): Payer: Self-pay

## 2021-10-24 MED ORDER — WEGOVY 0.25 MG/0.5ML ~~LOC~~ SOAJ
SUBCUTANEOUS | 0 refills | Status: DC
  Filled 2021-10-24 – 2021-11-16 (×2): qty 2, 28d supply, fill #0

## 2021-10-31 ENCOUNTER — Other Ambulatory Visit (HOSPITAL_BASED_OUTPATIENT_CLINIC_OR_DEPARTMENT_OTHER): Payer: Self-pay

## 2021-11-04 ENCOUNTER — Other Ambulatory Visit (HOSPITAL_BASED_OUTPATIENT_CLINIC_OR_DEPARTMENT_OTHER): Payer: Self-pay

## 2021-11-07 ENCOUNTER — Other Ambulatory Visit (HOSPITAL_BASED_OUTPATIENT_CLINIC_OR_DEPARTMENT_OTHER): Payer: Self-pay

## 2021-11-08 ENCOUNTER — Other Ambulatory Visit (HOSPITAL_BASED_OUTPATIENT_CLINIC_OR_DEPARTMENT_OTHER): Payer: Self-pay

## 2021-11-16 ENCOUNTER — Other Ambulatory Visit (HOSPITAL_BASED_OUTPATIENT_CLINIC_OR_DEPARTMENT_OTHER): Payer: Self-pay

## 2021-11-29 ENCOUNTER — Other Ambulatory Visit (HOSPITAL_BASED_OUTPATIENT_CLINIC_OR_DEPARTMENT_OTHER): Payer: Self-pay

## 2022-04-10 ENCOUNTER — Other Ambulatory Visit (HOSPITAL_BASED_OUTPATIENT_CLINIC_OR_DEPARTMENT_OTHER): Payer: Self-pay

## 2022-04-10 MED ORDER — WEGOVY 0.25 MG/0.5ML ~~LOC~~ SOAJ
0.2500 mg | SUBCUTANEOUS | 0 refills | Status: DC
  Filled 2022-04-10 – 2022-05-02 (×2): qty 2, 28d supply, fill #0

## 2022-04-11 ENCOUNTER — Other Ambulatory Visit (HOSPITAL_BASED_OUTPATIENT_CLINIC_OR_DEPARTMENT_OTHER): Payer: Self-pay

## 2022-04-11 ENCOUNTER — Other Ambulatory Visit: Payer: Self-pay | Admitting: Physician Assistant

## 2022-04-11 DIAGNOSIS — Z1231 Encounter for screening mammogram for malignant neoplasm of breast: Secondary | ICD-10-CM

## 2022-04-12 ENCOUNTER — Other Ambulatory Visit (HOSPITAL_BASED_OUTPATIENT_CLINIC_OR_DEPARTMENT_OTHER): Payer: Self-pay

## 2022-04-18 ENCOUNTER — Other Ambulatory Visit (HOSPITAL_COMMUNITY): Payer: Self-pay

## 2022-04-18 ENCOUNTER — Other Ambulatory Visit (HOSPITAL_BASED_OUTPATIENT_CLINIC_OR_DEPARTMENT_OTHER): Payer: Self-pay

## 2022-04-19 ENCOUNTER — Other Ambulatory Visit (HOSPITAL_BASED_OUTPATIENT_CLINIC_OR_DEPARTMENT_OTHER): Payer: Self-pay

## 2022-04-19 ENCOUNTER — Other Ambulatory Visit: Payer: Self-pay

## 2022-04-20 ENCOUNTER — Other Ambulatory Visit (HOSPITAL_BASED_OUTPATIENT_CLINIC_OR_DEPARTMENT_OTHER): Payer: Self-pay

## 2022-04-21 ENCOUNTER — Other Ambulatory Visit (HOSPITAL_BASED_OUTPATIENT_CLINIC_OR_DEPARTMENT_OTHER): Payer: Self-pay

## 2022-04-24 ENCOUNTER — Other Ambulatory Visit (HOSPITAL_BASED_OUTPATIENT_CLINIC_OR_DEPARTMENT_OTHER): Payer: Self-pay

## 2022-04-26 ENCOUNTER — Other Ambulatory Visit (HOSPITAL_BASED_OUTPATIENT_CLINIC_OR_DEPARTMENT_OTHER): Payer: Self-pay

## 2022-04-27 ENCOUNTER — Other Ambulatory Visit (HOSPITAL_BASED_OUTPATIENT_CLINIC_OR_DEPARTMENT_OTHER): Payer: Self-pay

## 2022-04-27 ENCOUNTER — Other Ambulatory Visit: Payer: Self-pay

## 2022-04-27 MED ORDER — AMLODIPINE BESYLATE 5 MG PO TABS
5.0000 mg | ORAL_TABLET | Freq: Every day | ORAL | 1 refills | Status: DC
  Filled 2022-04-27: qty 30, 30d supply, fill #0

## 2022-04-27 MED ORDER — PROMETHAZINE-DM 6.25-15 MG/5ML PO SYRP
ORAL_SOLUTION | ORAL | 0 refills | Status: DC
  Filled 2022-04-27: qty 120, 4d supply, fill #0

## 2022-04-27 MED ORDER — PAXLOVID (300/100) 20 X 150 MG & 10 X 100MG PO TBPK
ORAL_TABLET | ORAL | 0 refills | Status: DC
  Filled 2022-04-27: qty 30, 5d supply, fill #0

## 2022-05-01 ENCOUNTER — Other Ambulatory Visit (HOSPITAL_BASED_OUTPATIENT_CLINIC_OR_DEPARTMENT_OTHER): Payer: Self-pay

## 2022-05-02 ENCOUNTER — Other Ambulatory Visit: Payer: Self-pay

## 2022-05-02 ENCOUNTER — Other Ambulatory Visit (HOSPITAL_BASED_OUTPATIENT_CLINIC_OR_DEPARTMENT_OTHER): Payer: Self-pay

## 2022-05-11 ENCOUNTER — Encounter: Payer: Self-pay | Admitting: Physician Assistant

## 2022-05-17 ENCOUNTER — Other Ambulatory Visit (HOSPITAL_BASED_OUTPATIENT_CLINIC_OR_DEPARTMENT_OTHER): Payer: Self-pay

## 2022-07-04 ENCOUNTER — Other Ambulatory Visit: Payer: Self-pay

## 2022-11-11 ENCOUNTER — Other Ambulatory Visit: Payer: Self-pay

## 2022-11-11 ENCOUNTER — Emergency Department (HOSPITAL_COMMUNITY): Payer: BC Managed Care – PPO

## 2022-11-11 ENCOUNTER — Emergency Department (HOSPITAL_COMMUNITY)
Admission: EM | Admit: 2022-11-11 | Discharge: 2022-11-11 | Disposition: A | Discharge status: Home / Self Care | Payer: BC Managed Care – PPO | Attending: Emergency Medicine | Admitting: Emergency Medicine

## 2022-11-11 ENCOUNTER — Encounter (HOSPITAL_COMMUNITY): Payer: Self-pay

## 2022-11-11 DIAGNOSIS — N10 Acute tubulo-interstitial nephritis: Secondary | ICD-10-CM | POA: Insufficient documentation

## 2022-11-11 DIAGNOSIS — Z20822 Contact with and (suspected) exposure to covid-19: Secondary | ICD-10-CM | POA: Insufficient documentation

## 2022-11-11 DIAGNOSIS — R509 Fever, unspecified: Secondary | ICD-10-CM | POA: Diagnosis present

## 2022-11-11 LAB — URINALYSIS, ROUTINE W REFLEX MICROSCOPIC
Bilirubin Urine: NEGATIVE
Glucose, UA: NEGATIVE mg/dL
Ketones, ur: NEGATIVE mg/dL
Nitrite: NEGATIVE
Protein, ur: 30 mg/dL — AB
Specific Gravity, Urine: 1.013 (ref 1.005–1.030)
pH: 5 (ref 5.0–8.0)

## 2022-11-11 LAB — COMPREHENSIVE METABOLIC PANEL
ALT: 26 U/L (ref 0–44)
AST: 27 U/L (ref 15–41)
Albumin: 3.6 g/dL (ref 3.5–5.0)
Alkaline Phosphatase: 84 U/L (ref 38–126)
Anion gap: 11 (ref 5–15)
BUN: 11 mg/dL (ref 6–20)
CO2: 24 mmol/L (ref 22–32)
Calcium: 8.9 mg/dL (ref 8.9–10.3)
Chloride: 101 mmol/L (ref 98–111)
Creatinine, Ser: 1.3 mg/dL — ABNORMAL HIGH (ref 0.44–1.00)
GFR, Estimated: 48 mL/min — ABNORMAL LOW (ref >60)
Glucose, Bld: 135 mg/dL — ABNORMAL HIGH (ref 70–99)
Potassium: 3.6 mmol/L (ref 3.5–5.1)
Sodium: 136 mmol/L (ref 135–145)
Total Bilirubin: 0.5 mg/dL (ref 0.3–1.2)
Total Protein: 7.4 g/dL (ref 6.5–8.1)

## 2022-11-11 LAB — CBC WITH DIFFERENTIAL/PLATELET
Abs Immature Granulocytes: 0.05 10*3/uL (ref 0.00–0.07)
Basophils Absolute: 0 10*3/uL (ref 0.0–0.1)
Basophils Relative: 0 %
Eosinophils Absolute: 0 10*3/uL (ref 0.0–0.5)
Eosinophils Relative: 0 %
HCT: 38.8 % (ref 36.0–46.0)
Hemoglobin: 12.9 g/dL (ref 12.0–15.0)
Immature Granulocytes: 1 %
Lymphocytes Relative: 17 %
Lymphs Abs: 1.3 10*3/uL (ref 0.7–4.0)
MCH: 30.9 pg (ref 26.0–34.0)
MCHC: 33.2 g/dL (ref 30.0–36.0)
MCV: 92.8 fL (ref 80.0–100.0)
Monocytes Absolute: 0.6 10*3/uL (ref 0.1–1.0)
Monocytes Relative: 8 %
Neutro Abs: 5.8 10*3/uL (ref 1.7–7.7)
Neutrophils Relative %: 74 %
Platelets: 274 10*3/uL (ref 150–400)
RBC: 4.18 MIL/uL (ref 3.87–5.11)
RDW: 12.5 % (ref 11.5–15.5)
WBC: 7.8 10*3/uL (ref 4.0–10.5)
nRBC: 0 % (ref 0.0–0.2)

## 2022-11-11 LAB — RESP PANEL BY RT-PCR (RSV, FLU A&B, COVID)  RVPGX2
Influenza A by PCR: NEGATIVE
Influenza B by PCR: NEGATIVE
Resp Syncytial Virus by PCR: NEGATIVE
SARS Coronavirus 2 by RT PCR: NEGATIVE

## 2022-11-11 LAB — I-STAT CG4 LACTIC ACID, ED: Lactic Acid, Venous: 0.4 mmol/L — ABNORMAL LOW (ref 0.5–1.9)

## 2022-11-11 MED ORDER — SODIUM CHLORIDE 0.9 % IV BOLUS
1000.0000 mL | Freq: Once | INTRAVENOUS | Status: AC
  Administered 2022-11-11: 1000 mL via INTRAVENOUS

## 2022-11-11 MED ORDER — SODIUM CHLORIDE 0.9 % IV SOLN
2.0000 g | Freq: Once | INTRAVENOUS | Status: AC
  Administered 2022-11-11: 2 g via INTRAVENOUS
  Filled 2022-11-11: qty 20

## 2022-11-11 MED ORDER — ACETAMINOPHEN 325 MG PO TABS
650.0000 mg | ORAL_TABLET | Freq: Once | ORAL | Status: AC | PRN
  Administered 2022-11-11: 650 mg via ORAL
  Filled 2022-11-11: qty 2

## 2022-11-11 MED ORDER — CEPHALEXIN 500 MG PO CAPS: 500.0000 mg | ORAL_CAPSULE | Freq: Three times a day (TID) | ORAL | 0 refills | Status: AC

## 2022-11-11 NOTE — Discharge Instructions (Signed)
Continue Tylenol and ibuprofen for fever.  Take next dose of antibiotic tonight at dinnertime.  Please return if symptoms worsen.  Follow-up with your primary care doctor.

## 2022-11-11 NOTE — ED Triage Notes (Signed)
Pt arrived from home via [facility] c/o body aches, head ache and fever. Pt states that she started to feel bad Monday night and has just progressively gotten worse. Pt states that her daughter is also sick at present.

## 2022-11-11 NOTE — ED Provider Notes (Signed)
EMERGENCY DEPARTMENT AT [facility] Provider Note   CSN: [id number] Arrival date & time: 11/11/22  0231     History  Chief Complaint  Patient presents with   Fever    Gabriella Larson is a 57 y.o. female.  Patient here with bodyaches and fever.  Symptoms for few days.  Is having some right flank, suprapubic pain.  Denies any cough or sputum production.  Denies any specific urinary symptoms.  Denies any nausea vomiting diarrhea.  Denies chest pain shortness of breath.  No significant medical problems.  Nothing makes it worse or better.  The history is provided by the patient.       Home Medications Prior to Admission medications   Medication Sig Start Date End Date Taking? Authorizing Provider  cephALEXin (KEFLEX) 500 MG capsule Take 1 capsule (500 mg total) by mouth 3 (three) times daily for 7 days. 11/11/22 11/18/22 Yes Cariana Karge, DO  amLODipine (NORVASC) 5 MG tablet Take 1 tablet (5 mg total) by mouth daily. 04/27/22     nirmatrelvir & ritonavir (PAXLOVID, 300/100,) 20 x 150 MG & 10 x 100MG  TBPK Take 3 tablets twice a day by mouth for 5 days. 04/27/22     nortriptyline (PAMELOR) 25 MG capsule Take 1 capsule (25 mg total) by mouth at bedtime. Patient not taking: Reported on 10/02/2018 05/29/11 10/02/27  Lenda Kelp, MD  omeprazole (PRILOSEC) 20 MG capsule Take 1 capsule (20 mg total) by mouth daily. 12/08/19   Sabas Sous, MD  oxyCODONE (ROXICODONE) 5 MG immediate release tablet Take 1 tablet (5 mg total) by mouth every 4 (four) hours as needed for severe pain. 12/08/19   Sabas Sous, MD  promethazine-dextromethorphan (PROMETHAZINE-DM) 6.25-15 MG/5ML syrup Take 5 milliliters by mouth every 4 hours. 04/27/22     Semaglutide-Weight Management (WEGOVY) 0.25 MG/0.5ML SOAJ Inject 0.25 mg every week by subcutaneous route. 10/24/21     Semaglutide-Weight Management (WEGOVY) 0.25 MG/0.5ML SOAJ Inject 0.25 mg into the skin once weekly 04/10/22     sucralfate (CARAFATE)  1 g tablet Take 1 tablet (1 g total) by mouth 4 (four) times daily as needed. 12/08/19   Sabas Sous, MD      Allergies    Patient has no active allergies.    Review of Systems   Review of Systems  Physical Exam Updated Vital Signs BP 120/77   Pulse 97   Temp 98.9 F (37.2 C) (Oral)   Resp 16   SpO2 98%  Physical Exam Vitals and nursing note reviewed.  Constitutional:      General: She is not in acute distress.    Appearance: She is well-developed. She is not ill-appearing.  HENT:     Head: Normocephalic and atraumatic.     Nose: Nose normal.     Mouth/Throat:     Mouth: Mucous membranes are moist.  Eyes:     Extraocular Movements: Extraocular movements intact.     Conjunctiva/sclera: Conjunctivae normal.     Pupils: Pupils are equal, round, and reactive to light.  Cardiovascular:     Rate and Rhythm: Normal rate and regular rhythm.     Pulses: Normal pulses.     Heart sounds: Normal heart sounds. No murmur heard. Pulmonary:     Effort: Pulmonary effort is normal. No respiratory distress.     Breath sounds: Normal breath sounds.  Abdominal:     Palpations: Abdomen is soft.     Tenderness: There is abdominal  tenderness. There is right CVA tenderness.     Comments: Suprapubic pain  Musculoskeletal:        General: No swelling.     Cervical back: Normal range of motion and neck supple.  Skin:    General: Skin is warm and dry.     Capillary Refill: Capillary refill takes less than 2 seconds.  Neurological:     General: No focal deficit present.     Mental Status: She is alert.  Psychiatric:        Mood and Affect: Mood normal.     ED Results / Procedures / Treatments   Labs (all labs ordered are listed, but only abnormal results are displayed) Labs Reviewed  COMPREHENSIVE METABOLIC PANEL - Abnormal; Notable for the following components:      Result Value   Glucose, Bld 135 (*)    Creatinine, Ser 1.30 (*)    GFR, Estimated 48 (*)    All other components  within normal limits  URINALYSIS, ROUTINE W REFLEX MICROSCOPIC - Abnormal; Notable for the following components:   APPearance HAZY (*)    Hgb urine dipstick SMALL (*)    Protein, ur 30 (*)    Leukocytes,Ua SMALL (*)    Bacteria, UA MANY (*)    All other components within normal limits  I-STAT CG4 LACTIC ACID, ED - Abnormal; Notable for the following components:   Lactic Acid, Venous 0.4 (*)    All other components within normal limits  RESP PANEL BY RT-PCR (RSV, FLU A&B, COVID)  [id number]  URINE CULTURE  CBC WITH DIFFERENTIAL/PLATELET    EKG None  Radiology CT Renal Stone Study  Result Date: 11/11/2022 CLINICAL DATA:  Abdominal and flank pain for a few days. EXAM: CT ABDOMEN AND PELVIS WITHOUT CONTRAST TECHNIQUE: Multidetector CT imaging of the abdomen and pelvis was performed following the standard protocol without IV contrast. RADIATION DOSE REDUCTION: This exam was performed according to the departmental dose-optimization program which includes automated exposure control, adjustment of the mA and/or kV according to patient size and/or use of iterative reconstruction technique. COMPARISON:  CT scan 12/08/2019 FINDINGS: Lower chest: The lung bases are clear of acute process. No pleural effusion or pulmonary lesions. The heart is normal in size. No pericardial effusion. The distal esophagus and aorta are unremarkable. Hepatobiliary: No hepatic lesions are identified without contrast. No intrahepatic biliary dilatation. The gallbladder is unremarkable. No common bile duct dilatation. Pancreas: No mass, inflammation or ductal dilatation. Spleen: Normal size.  No focal lesions. Adrenals/Urinary Tract: Adrenal glands are normal. Perinephric interstitial changes surrounding the right kidney which appears slightly enlarged and possibly edematous. No renal or obstructing ureteral calculi are identified. Could not exclude pyelonephritis. Recommend correlation with urinalysis. The left kidney appears  normal. No bladder lesions or bladder calculi. Stomach/Bowel: The stomach, duodenum, small bowel and colon are grossly normal without oral contrast. No inflammatory changes, mass lesions or obstructive findings. The terminal ileum is normal. The appendix is not identified for certain but no findings suspicious appendicitis. Vascular/Lymphatic: The aorta is normal in caliber. No atheroscerlotic calcifications. No mesenteric of retroperitoneal mass or adenopathy. Small scattered lymph nodes are noted. Reproductive: The uterus is surgically absent. Both ovaries are still present and appear normal. Other: No pelvic mass or adenopathy. No free pelvic fluid collections. No inguinal mass or adenopathy. No abdominal wall hernia or subcutaneous lesions. Musculoskeletal: No significant bony findings. Moderate lumbar facet disease age advanced bilateral hip joint degenerative changes. IMPRESSION: 1. Perinephric interstitial changes surrounding the  right kidney which appears slightly enlarged and possibly edematous. No renal or obstructing ureteral calculi are identified. Could not exclude pyelonephritis. Recommend correlation with urinalysis. 2. No other significant abdominal/pelvic findings, mass lesions or adenopathy. Electronically Signed   By: Rudie Meyer M.D.   On: 11/11/2022 09:10   DG Chest 2 View  Result Date: 11/11/2022 CLINICAL DATA:  Cough EXAM: CHEST - 2 VIEW COMPARISON:  None Available. FINDINGS: Lungs are well expanded, symmetric, and clear. No pneumothorax or pleural effusion. Cardiac size within normal limits. Pulmonary vascularity is normal. Osseous structures are age-appropriate. No acute bone abnormality. IMPRESSION: No active cardiopulmonary disease. Electronically Signed   By: Helyn Numbers M.D.   On: 11/11/2022 04:09    Procedures Procedures    Medications Ordered in ED Medications  acetaminophen (TYLENOL) tablet 650 mg (650 mg Oral Given 11/11/22 0407)  sodium chloride 0.9 % bolus 1,000 mL  (1,000 mLs Intravenous New Bag/Given 11/11/22 0731)  cefTRIAXone (ROCEPHIN) 2 g in sodium chloride 0.9 % 100 mL IVPB (0 g Intravenous Stopped 11/11/22 0801)    ED Course/ Medical Decision Making/ A&P                                 Medical Decision Making Amount and/or Complexity of Data Reviewed Labs: ordered. Radiology: ordered.  Risk OTC drugs.   SHAREEN KINSEL is here with fever, body aches.  Normal vitals no fever upon my evaluation however when she arrived she is febrile and mildly tachycardic.  Blood work has already resulted prior to my evaluation.  She has been having some suprapubic abdominal pain, fever and bodyaches for the last few days.  It appears that she has a urine infection on my evaluation of her labs.  However she has no white count or lactic acidosis and have low concern for sepsis at this time.  No history of kidney stones.  She is not on any immunocompromising medications or chemotherapy.  Overall I do suspect a UTI however given some abdominal pain and flank pain we will get a CT scan to rule out kidney stone or other acute process.  Will give fluid bolus, send urine culture and give a dose of IV antibiotics.  She has no significant leukocytosis anemia or electrolyte abnormality per my review interpretation labs.  Creatinine is 1.3.  CT scan shows no evidence of kidney stones.  Some changes around the right kidney that could be consistent with pyelonephritis given urinalysis.  Ultimately I do not think she is septic.  She is feeling much better after IV fluids and IV antibiotics.  Will send urine culture.  Will trial outpatient treatment with antibiotics.  She understands return precautions.  Recommend continued use of Tylenol and ibuprofen for fever.  Discharged in good condition.  Recommend follow-up with primary care doctor.  This chart was dictated using voice recognition software.  Despite best efforts to proofread,  errors can occur which can change the documentation  meaning.         Final Clinical Impression(s) / ED Diagnoses Final diagnoses:  Acute pyelonephritis    Rx / DC Orders ED Discharge Orders          Ordered    cephALEXin (KEFLEX) 500 MG capsule  3 times daily        11/11/22 0934              Virgina Norfolk, DO 11/11/22 [phone]

## 2022-11-13 LAB — URINE CULTURE: Culture: >=100000 — AB

## 2022-11-14 ENCOUNTER — Telehealth (HOSPITAL_BASED_OUTPATIENT_CLINIC_OR_DEPARTMENT_OTHER): Payer: Self-pay | Admitting: *Deleted

## 2022-11-14 NOTE — Progress Notes (Signed)
ED Antimicrobial Stewardship Positive Culture Follow Up   Gabriella Larson is an 57 y.o. female who presented to [facility] on 11/11/2022 with a chief complaint of  Chief Complaint  Patient presents with   Fever    Recent Results (from the past 720 hour(s))  Resp panel by RT-PCR (RSV, Flu A&B, Covid) Anterior Nasal Swab     Status: None   Collection Time: 11/11/22  3:24 AM   Specimen: Anterior Nasal Swab  Result Value Ref Range Status   SARS Coronavirus 2 by RT PCR NEGATIVE NEGATIVE Final   Influenza A by PCR NEGATIVE NEGATIVE Final   Influenza B by PCR NEGATIVE NEGATIVE Final    Comment: (NOTE) The Xpert Xpress SARS-CoV-2/FLU/RSV plus assay is intended as an aid in the diagnosis of influenza from Nasopharyngeal swab specimens and should not be used as a sole basis for treatment. Nasal washings and aspirates are unacceptable for Xpert Xpress SARS-CoV-2/FLU/RSV testing.  Fact Sheet for Patients: BloggerCourse.com  Fact Sheet for Healthcare Providers: SeriousBroker.it  This test is not yet approved or cleared by the [place] FDA and has been authorized for detection and/or diagnosis of SARS-CoV-2 by FDA under an Emergency Use Authorization (EUA). This EUA will remain in effect (meaning this test can be used) for the duration of the COVID-19 declaration under Section 564(b)(1) of the Act, 21 U.S.C. section 360bbb-3(b)(1), unless the authorization is terminated or revoked.     Resp Syncytial Virus by PCR NEGATIVE NEGATIVE Final    Comment: (NOTE) Fact Sheet for Patients: BloggerCourse.com  Fact Sheet for Healthcare Providers: SeriousBroker.it  This test is not yet approved or cleared by the [place] FDA and has been authorized for detection and/or diagnosis of SARS-CoV-2 by FDA under an Emergency Use Authorization (EUA). This EUA will remain in effect (meaning this  test can be used) for the duration of the COVID-19 declaration under Section 564(b)(1) of the Act, 21 U.S.C. section 360bbb-3(b)(1), unless the authorization is terminated or revoked.  Performed at [facility], [address] [place], [place] [address]   Urine Culture     Status: Abnormal   Collection Time: 11/11/22  7:18 AM   Specimen: Urine, Clean Catch  Result Value Ref Range Status   Specimen Description URINE, CLEAN CATCH  Final   Special Requests NONE  Final   Culture (A)  Final    >=100,000 COLONIES/mL ESCHERICHIA COLI >=100,000 COLONIES/mL DIPHTHEROIDS(CORYNEBACTERIUM SPECIES) Standardized susceptibility testing for this organism is not available. Performed at [facility], [address], [place], [place] [address]    Report Status 11/13/2022 FINAL  Final   Organism ID, Bacteria ESCHERICHIA COLI (A)  Final      Susceptibility   Escherichia coli - MIC*    AMPICILLIN >=32 RESISTANT Resistant     CEFAZOLIN <=4 SENSITIVE Sensitive     CEFEPIME <=0.12 SENSITIVE Sensitive     CEFTRIAXONE <=0.25 SENSITIVE Sensitive     CIPROFLOXACIN <=0.25 SENSITIVE Sensitive     GENTAMICIN <=1 SENSITIVE Sensitive     IMIPENEM <=0.25 SENSITIVE Sensitive     NITROFURANTOIN <=16 SENSITIVE Sensitive     TRIMETH/SULFA >=320 RESISTANT Resistant     AMPICILLIN/SULBACTAM 16 INTERMEDIATE Intermediate     PIP/TAZO <=4 SENSITIVE Sensitive     * >=100,000 COLONIES/mL ESCHERICHIA COLI    Call patient to see if symptoms (flank, suprapubic, urinary) have resolved. If yes, continue cephalexin prescription. If no, start linezolid 600mg  PO BID x 7 days along with cephalexin.  New antibiotic prescription: linezolid 600mg  PO BID x 7 days  ED Provider: Doreatha Martin, MD  Eldridge Scot, PharmD, BCCCP Clinical Pharmacist 11/14/2022, 9:42 AM Clinical Pharmacist Monday - Friday phone -  [phone] Saturday - Sunday phone - [phone]

## 2022-11-14 NOTE — Telephone Encounter (Signed)
Post ED Visit - Positive Culture Follow-up: Unsuccessful Patient Follow-up  Culture assessed and recommendations reviewed by:  []  Enzo Bi, Pharm.D. []  Celedonio Miyamoto, Pharm.D., BCPS AQ-ID []  Garvin Fila, Pharm.D., BCPS []  Georgina Pillion, Pharm.D., BCPS []  [place], [address].D., BCPS, AAHIVP []  Estella Husk, Pharm.D., BCPS, AAHIVP []  Sherlynn Carbon, PharmD []  Pollyann Samples, PharmD, BCPS  Positive urine culture  []  Patient discharged without antimicrobial prescription and treatment is now indicated []  Organism is resistant to prescribed ED discharge antimicrobial []  Patient with positive blood cultures  Plan:  If UTI symptoms resolved complete Cephalexin.  If no start Linezolid 600mg  PO BID x 7 adays along with the Cephalexin, Theda Belfast, MD   Unable to contact patient after 3 attempts, letter will be sent to address on file  Lysle Pearl 11/14/2022, 10:16 AM

## 2023-02-06 ENCOUNTER — Other Ambulatory Visit: Payer: Self-pay | Admitting: Family Medicine

## 2023-02-06 ENCOUNTER — Ambulatory Visit
Admission: RE | Admit: 2023-02-06 | Discharge: 2023-02-06 | Disposition: A | Discharge status: Home / Self Care | Payer: BC Managed Care – PPO | Source: Ambulatory Visit | Attending: Family Medicine | Admitting: Family Medicine

## 2023-02-06 DIAGNOSIS — R059 Cough, unspecified: Secondary | ICD-10-CM

## 2023-02-10 ENCOUNTER — Encounter (HOSPITAL_COMMUNITY): Payer: Self-pay

## 2023-02-10 ENCOUNTER — Ambulatory Visit (HOSPITAL_COMMUNITY)
Admission: EM | Admit: 2023-02-10 | Discharge: 2023-02-10 | Disposition: A | Discharge status: Home / Self Care | Payer: BC Managed Care – PPO

## 2023-02-10 DIAGNOSIS — J189 Pneumonia, unspecified organism: Secondary | ICD-10-CM | POA: Diagnosis not present

## 2023-02-10 MED ORDER — PROMETHAZINE-DM 6.25-15 MG/5ML PO SYRP: 5.0000 mL | ORAL_SOLUTION | Freq: Four times a day (QID) | ORAL | 0 refills | Status: DC | PRN

## 2023-02-10 MED ORDER — AZITHROMYCIN 250 MG PO TABS: 250.0000 mg | ORAL_TABLET | Freq: Every day | ORAL | 0 refills | Status: DC

## 2023-02-10 MED ORDER — ALBUTEROL SULFATE HFA 108 (90 BASE) MCG/ACT IN AERS
1.0000 | INHALATION_SPRAY | Freq: Four times a day (QID) | RESPIRATORY_TRACT | 0 refills | Status: AC | PRN
Start: 2023-02-10 — End: ?

## 2023-02-10 NOTE — ED Provider Notes (Signed)
[facility]    CSN: [id number] Arrival date & time: 02/10/23  1135      History   Chief Complaint Chief Complaint  Patient presents with   Cough   Shortness of Breath   Nasal Congestion    HPI DONNARAE SATTERFIELD is a 57 y.o. female.   57 yr old female who presents to urgent care with sob, cough, sore throat, congestion. Symptoms started around halloween with sore throat and dry cough. The cough is now productive with greenish yellow mucus. Also have feverish feelings with chills. Congestion present with sinus pressure and pain along with headache. No sick contacts. The shortness of breath is normally at night and has to sit up to catch breath.    Cough Associated symptoms: headaches, shortness of breath and sore throat   Associated symptoms: no chest pain, no chills, no ear pain, no fever and no rash   Shortness of Breath Associated symptoms: cough, headaches and sore throat   Associated symptoms: no abdominal pain, no chest pain, no ear pain, no fever, no rash and no vomiting     Past Medical History:  Diagnosis Date   Spinal stenosis     Patient Active Problem List   Diagnosis Date Noted   Low back pain 05/30/2011    Past Surgical History:  Procedure Laterality Date   ABDOMINAL HYSTERECTOMY     REPLACEMENT TOTAL KNEE Right    SHOULDER SURGERY  2010   right rotator cuff    OB History   No obstetric history on file.      Home Medications    Prior to Admission medications   Medication Sig Start Date End Date Taking? Authorizing Provider  clonazePAM (KLONOPIN) 0.5 MG tablet Take 0.5 mg by mouth 3 (three) times daily as needed for anxiety.   Yes [provider]  meloxicam (MOBIC) 15 MG tablet Take 15 mg by mouth daily.   Yes [provider]  amLODipine (NORVASC) 5 MG tablet Take 1 tablet (5 mg total) by mouth daily. 04/27/22     nirmatrelvir & ritonavir (PAXLOVID, 300/100,) 20 x 150 MG & 10 x 100MG  TBPK Take 3 tablets twice a day by  mouth for 5 days. 04/27/22     nortriptyline (PAMELOR) 25 MG capsule Take 1 capsule (25 mg total) by mouth at bedtime. Patient not taking: Reported on 10/02/2018 05/29/11 10/02/27  Lenda Kelp, MD  omeprazole (PRILOSEC) 20 MG capsule Take 1 capsule (20 mg total) by mouth daily. 12/08/19   Sabas Sous, MD  oxyCODONE (ROXICODONE) 5 MG immediate release tablet Take 1 tablet (5 mg total) by mouth every 4 (four) hours as needed for severe pain. 12/08/19   Sabas Sous, MD  promethazine-dextromethorphan (PROMETHAZINE-DM) 6.25-15 MG/5ML syrup Take 5 milliliters by mouth every 4 hours. 04/27/22     Semaglutide-Weight Management (WEGOVY) 0.25 MG/0.5ML SOAJ Inject 0.25 mg every week by subcutaneous route. 10/24/21     Semaglutide-Weight Management (WEGOVY) 0.25 MG/0.5ML SOAJ Inject 0.25 mg into the skin once weekly 04/10/22     sucralfate (CARAFATE) 1 g tablet Take 1 tablet (1 g total) by mouth 4 (four) times daily as needed. 12/08/19   Sabas Sous, MD    Family History Family History  Problem Relation Age of Onset   Diabetes Mother    Hyperlipidemia Mother    Hypertension Mother    Heart attack Neg Hx    Sudden death Neg Hx     Social History Social History  Tobacco Use   Smoking status: Never   Smokeless tobacco: Never  Vaping Use   Vaping status: Never Used  Substance Use Topics   Alcohol use: Yes    Comment: occ   Drug use: Never     Allergies   Patient has no known allergies.   Review of Systems Review of Systems  Constitutional:  Negative for chills and fever.  HENT:  Positive for congestion, sinus pressure and sore throat. Negative for ear pain.   Eyes:  Negative for pain and visual disturbance.  Respiratory:  Positive for cough, chest tightness and shortness of breath.   Cardiovascular:  Negative for chest pain and palpitations.  Gastrointestinal:  Negative for abdominal pain and vomiting.  Genitourinary:  Positive for frequency (stress incontinence due to cough).  Negative for dysuria and hematuria.  Musculoskeletal:  Positive for back pain. Negative for arthralgias.  Skin:  Negative for color change and rash.  Neurological:  Positive for headaches. Negative for seizures and syncope.  All other systems reviewed and are negative.    Physical Exam Triage Vital Signs ED Triage Vitals [02/10/23 1323]  Encounter Vitals Group     BP 135/81     Systolic BP Percentile      Diastolic BP Percentile      Pulse Rate 79     Resp 16     Temp 97.9 F (36.6 C)     Temp Source Oral     SpO2 98 %     Weight      Height      Head Circumference      Peak Flow      Pain Score 0     Pain Loc      Pain Education      Exclude from Growth Chart    No data found.  Updated Vital Signs BP 135/81 (BP Location: Right Arm)   Pulse 79   Temp 97.9 F (36.6 C) (Oral)   Resp 16   SpO2 98%   Visual Acuity Right Eye Distance:   Left Eye Distance:   Bilateral Distance:    Right Eye Near:   Left Eye Near:    Bilateral Near:     Physical Exam Vitals and nursing note reviewed.  Constitutional:      General: She is not in acute distress.    Appearance: She is well-developed.  HENT:     Head: Normocephalic and atraumatic.     Mouth/Throat:     Mouth: Mucous membranes are moist.     Pharynx: Oropharynx is clear.  Eyes:     Conjunctiva/sclera: Conjunctivae normal.  Cardiovascular:     Rate and Rhythm: Normal rate and regular rhythm.     Heart sounds: No murmur heard. Pulmonary:     Effort: Pulmonary effort is normal. No respiratory distress.     Breath sounds: Examination of the right-upper field reveals decreased breath sounds. Examination of the left-upper field reveals decreased breath sounds and rhonchi. Examination of the left-middle field reveals decreased breath sounds. Decreased breath sounds and rhonchi present. No wheezing.  Abdominal:     Palpations: Abdomen is soft.     Tenderness: There is no abdominal tenderness.  Musculoskeletal:         General: No swelling.     Cervical back: Neck supple.  Skin:    General: Skin is warm and dry.     Capillary Refill: Capillary refill takes less than 2 seconds.  Neurological:  Mental Status: She is alert.  Psychiatric:        Mood and Affect: Mood normal.      UC Treatments / Results  Labs (all labs ordered are listed, but only abnormal results are displayed) Labs Reviewed - No data to display  EKG   Radiology No results found.  Procedures Procedures (including critical care time)  Medications Ordered in UC Medications - No data to display  Initial Impression / Assessment and Plan / UC Course  I have reviewed the triage vital signs and the nursing notes.  Pertinent labs & imaging results that were available during my care of the patient were reviewed by me and considered in my medical decision making (see chart for details).     Pneumonia of left lung due to infectious organism, unspecified part of lung   X-ray from 11/12 reviewed but not read yet by radiology. Compared it to previous and seems stable but given symptoms and length of time, recommend treating with the following:  Azithromycin 250mg  take 2 tablets today, then 1 daily for 4 days. Take it with food Promethazine-DM 5 mLs every 6 hours as needed for cough. Use caution as this can cause you to become drowsy. Albuterol inhaler 1-2 puffs every 6 hours as needed for shortness of breath. This can make your heart go up.  Rest and hydrate Can use an OTC Mucinex (without DM) Return to urgent care or PCP if symptoms worsen or fail to resolve.    Final Clinical Impressions(s) / UC Diagnoses   Final diagnoses:  None   Discharge Instructions   None    ED Prescriptions   None    PDMP not reviewed this encounter.   Landis Martins, PA-C 02/10/23 1350

## 2023-02-10 NOTE — ED Triage Notes (Signed)
Patient c/o a productive cough with yellow/green sputum, SOB, nasal congestion x 2 weeks.  Patient states she has been taking Nyquil, Mucinex, and Ibuprofen.

## 2023-02-10 NOTE — Discharge Instructions (Addendum)
X-ray from 11/12 reviewed but not read yet by radiology. Compared it to previous and seems stable but given symptoms and length of time, recommend treating with the following:  Azithromycin 250mg  take 2 tablets today, then 1 daily for 4 days. Take it with food Promethazine-DM 5 mLs every 6 hours as needed for cough. Use caution as this can cause you to become drowsy. Albuterol inhaler 1-2 puffs every 6 hours as needed for shortness of breath. This can make your heart go up.  Rest and hydrate Can use an OTC Mucinex (without DM) Return to urgent care or PCP if symptoms worsen or fail to resolve.

## 2023-04-16 ENCOUNTER — Ambulatory Visit
Admission: EM | Admit: 2023-04-16 | Discharge: 2023-04-16 | Disposition: A | Discharge status: Home / Self Care | Payer: BC Managed Care – PPO | Attending: Family Medicine | Admitting: Family Medicine

## 2023-04-16 DIAGNOSIS — M62830 Muscle spasm of back: Secondary | ICD-10-CM | POA: Diagnosis not present

## 2023-04-16 MED ORDER — CYCLOBENZAPRINE HCL 10 MG PO TABS: 10.0000 mg | ORAL_TABLET | Freq: Three times a day (TID) | ORAL | 0 refills | Status: AC | PRN

## 2023-04-16 MED ORDER — MELOXICAM 7.5 MG PO TABS: 7.5000 mg | ORAL_TABLET | Freq: Every day | ORAL | 0 refills | Status: AC

## 2023-04-16 NOTE — Discharge Instructions (Addendum)
Over-the-counter extra strength Tylenol as directed on the package Follow-up with your primary care provider if symptoms fail to improve Stretching and moist heat application

## 2023-04-16 NOTE — ED Triage Notes (Signed)
Pt presents with complaints of mid-upper back spasms and "burning sensation under breasts" x 1 week. Pt currently rates her overall pain a 9/10, at night the pain worsens. Pt does state she has a lingering cold & is still coughing, unsure if this could be contributing to back spasms. Tylenol and Aleve are the only medications taken with no improvement.

## 2023-04-16 NOTE — ED Provider Notes (Signed)
[facility]    CSN: [id number] Arrival date & time: 04/16/23  0905      History   Chief Complaint Chief Complaint  Patient presents with   Spasms    Muscle spasms, back.     HPI Gabriella Larson is a 58 y.o. female.   The history is provided by the patient.   Spasms in her upper back onset several days ago worse with rolling over in bed.  Pain relieved with certain positions such as sitting upright admits a lot of coughing due to recent pneumonia also moved recently has doing a lot of lifting and bending.  Has had low back pain in the past secondary to degenerative disc disease. Denies fever, chest pain, shortness of breath, rash or skin changes, abdominal pain, nausea, vomiting Past Medical History:  Diagnosis Date   Spinal stenosis     Patient Active Problem List   Diagnosis Date Noted   Low back pain 05/30/2011    Past Surgical History:  Procedure Laterality Date   ABDOMINAL HYSTERECTOMY     REPLACEMENT TOTAL KNEE Right    SHOULDER SURGERY  2010   right rotator cuff    OB History   No obstetric history on file.      Home Medications    Prior to Admission medications   Medication Sig Start Date End Date Taking? Authorizing Provider  cyclobenzaprine (FLEXERIL) 10 MG tablet Take 1 tablet (10 mg total) by mouth 3 (three) times daily as needed for up to 5 days for muscle spasms. 04/16/23 04/21/23 Yes Meliton Rattan, PA  albuterol (VENTOLIN HFA) 108 (90 Base) MCG/ACT inhaler Inhale 1-2 puffs into the lungs every 6 (six) hours as needed for wheezing or shortness of breath. 02/10/23   White, Elizabeth A, PA-C  amLODipine (NORVASC) 5 MG tablet Take 1 tablet (5 mg total) by mouth daily. 04/27/22     azithromycin (ZITHROMAX) 250 MG tablet Take 1 tablet (250 mg total) by mouth daily. Take first 2 tablets together, then 1 every day until finished. 02/10/23   White, Elizabeth A, PA-C  clonazePAM (KLONOPIN) 0.5 MG tablet Take 0.5 mg by mouth 3 (three) times daily as  needed for anxiety.    [provider]  meloxicam (MOBIC) 15 MG tablet Take 15 mg by mouth daily.    [provider]  nirmatrelvir & ritonavir (PAXLOVID, 300/100,) 20 x 150 MG & 10 x 100MG  TBPK Take 3 tablets twice a day by mouth for 5 days. 04/27/22     nortriptyline (PAMELOR) 25 MG capsule Take 1 capsule (25 mg total) by mouth at bedtime. Patient not taking: Reported on 10/02/2018 05/29/11 10/02/27  Lenda Kelp, MD  Semaglutide-Weight Management (WEGOVY) 0.25 MG/0.5ML SOAJ Inject 0.25 mg every week by subcutaneous route. 10/24/21     Semaglutide-Weight Management (WEGOVY) 0.25 MG/0.5ML SOAJ Inject 0.25 mg into the skin once weekly 04/10/22     sucralfate (CARAFATE) 1 g tablet Take 1 tablet (1 g total) by mouth 4 (four) times daily as needed. 12/08/19   Sabas Sous, MD    Family History Family History  Problem Relation Age of Onset   Diabetes Mother    Hyperlipidemia Mother    Hypertension Mother    Heart attack Neg Hx    Sudden death Neg Hx     Social History Social History   Tobacco Use   Smoking status: Never   Smokeless tobacco: Never  Vaping Use   Vaping status: Never Used  Substance  Use Topics   Alcohol use: Yes    Comment: occ   Drug use: Never     Allergies   Patient has no known allergies.   Review of Systems Review of Systems  Constitutional:  Negative for chills, fatigue and fever.  Respiratory:  Positive for cough. Negative for chest tightness and shortness of breath.   Cardiovascular:  Negative for chest pain.  Gastrointestinal:  Negative for abdominal pain, blood in stool, nausea and vomiting.  Musculoskeletal:  Positive for back pain.  Skin:  Negative for rash.  Neurological:  Negative for light-headedness and headaches.     Physical Exam Triage Vital Signs ED Triage Vitals  Encounter Vitals Group     BP 04/16/23 0922 (!) 156/100     Systolic BP Percentile --      Diastolic BP Percentile --      Pulse Rate 04/16/23 0922 86      Resp 04/16/23 0922 18     Temp 04/16/23 0922 97.9 F (36.6 C)     Temp Source 04/16/23 0922 Oral     SpO2 04/16/23 0922 96 %     Weight 04/16/23 0920 250 lb (113.4 kg)     Height 04/16/23 0920 5\' 7"  (1.702 m)     Head Circumference --      Peak Flow --      Pain Score 04/16/23 0919 9     Pain Loc --      Pain Education --      Exclude from Growth Chart --    No data found.  Updated Vital Signs BP (!) 156/100 (BP Location: Right Arm)   Pulse 86   Temp 97.9 F (36.6 C) (Oral)   Resp 18   Ht 5\' 7"  (1.702 m)   Wt 250 lb (113.4 kg)   SpO2 96%   BMI 39.16 kg/m   Visual Acuity Right Eye Distance:   Left Eye Distance:   Bilateral Distance:    Right Eye Near:   Left Eye Near:    Bilateral Near:     Physical Exam Vitals reviewed.  Constitutional:      Appearance: She is not ill-appearing.  HENT:     Head: Normocephalic and atraumatic.     Right Ear: Tympanic membrane and ear canal normal.     Left Ear: Tympanic membrane and ear canal normal.     Nose: No rhinorrhea.     Mouth/Throat:     Pharynx: Oropharynx is clear.  Eyes:     Conjunctiva/sclera: Conjunctivae normal.  Cardiovascular:     Rate and Rhythm: Normal rate and regular rhythm.     Heart sounds: Normal heart sounds.  Pulmonary:     Effort: Pulmonary effort is normal. No respiratory distress.     Breath sounds: Normal breath sounds. No wheezing or rales.  Musculoskeletal:     Cervical back: Neck supple.     Thoracic back: Tenderness present. No bony tenderness.     Lumbar back: No bony tenderness.     Comments: Tender paraspinal thoracic muscles  Neurological:     Mental Status: She is alert and oriented to person, place, and time.     Motor: No weakness.     Coordination: Coordination normal.     Gait: Gait normal.  Psychiatric:        Mood and Affect: Mood normal.      UC Treatments / Results  Labs (all labs ordered are listed, but only abnormal results are displayed) Labs  Reviewed - No data  to display  EKG   Radiology No results found.  Procedures Procedures (including critical care time)  Medications Ordered in UC Medications - No data to display  Initial Impression / Assessment and Plan / UC Course  I have reviewed the triage vital signs and the nursing notes.  Pertinent labs & imaging results that were available during my care of the patient were reviewed by me and considered in my medical decision making (see chart for details).     58 year old female with thoracic back spasms worse with certain movements and rolling over in bed, admits recent pneumonia with lots of coughing and moved recently with a lot of lifting and bending.  No red flags, mild soft tissue tenderness on exam, will treat with laxer and NSAIDs Final Clinical Impressions(s) / UC Diagnoses   Final diagnoses:  Spasm of muscle, back     Discharge Instructions      Over-the-counter extra strength Tylenol as directed on the package Follow-up with your primary care provider if symptoms fail to improve Stretching and moist heat application   ED Prescriptions     Medication Sig Dispense Auth. Provider   cyclobenzaprine (FLEXERIL) 10 MG tablet Take 1 tablet (10 mg total) by mouth 3 (three) times daily as needed for up to 5 days for muscle spasms. 15 tablet Meliton Rattan, [place]      PDMP not reviewed this encounter.   Meliton Rattan, [place] 04/16/23 [phone]

## 2023-10-07 ENCOUNTER — Emergency Department (HOSPITAL_COMMUNITY)
Admission: EM | Admit: 2023-10-07 | Discharge: 2023-10-07 | Disposition: A | Discharge status: Home / Self Care | Attending: Emergency Medicine | Admitting: Emergency Medicine

## 2023-10-07 ENCOUNTER — Encounter (HOSPITAL_COMMUNITY): Payer: Self-pay

## 2023-10-07 ENCOUNTER — Other Ambulatory Visit: Payer: Self-pay

## 2023-10-07 DIAGNOSIS — Y93G1 Activity, food preparation and clean up: Secondary | ICD-10-CM | POA: Diagnosis not present

## 2023-10-07 DIAGNOSIS — Z23 Encounter for immunization: Secondary | ICD-10-CM | POA: Insufficient documentation

## 2023-10-07 DIAGNOSIS — S61211A Laceration without foreign body of left index finger without damage to nail, initial encounter: Secondary | ICD-10-CM | POA: Diagnosis present

## 2023-10-07 DIAGNOSIS — X58XXXA Exposure to other specified factors, initial encounter: Secondary | ICD-10-CM | POA: Diagnosis not present

## 2023-10-07 MED ORDER — LIDOCAINE HCL (PF) 1 % IJ SOLN
10.0000 mL | Freq: Once | INTRAMUSCULAR | Status: AC
  Administered 2023-10-07: 10 mL
  Filled 2023-10-07: qty 10

## 2023-10-07 MED ORDER — TETANUS-DIPHTH-ACELL PERTUSSIS 5-2.5-18.5 LF-MCG/0.5 IM SUSY
0.5000 mL | PREFILLED_SYRINGE | Freq: Once | INTRAMUSCULAR | Status: AC
  Administered 2023-10-07: 0.5 mL via INTRAMUSCULAR
  Filled 2023-10-07: qty 0.5

## 2023-10-07 NOTE — ED Provider Notes (Signed)
 [place] EM[facility]ENCY DEPARTMENT AT [facility] Provider Note   CSN: [id number] Arrival date & time: 10/07/23  1145     Patient presents with: Extremity Laceration   Gab[facility]ella Larson is a 58 y.o. female patient who presents to the emerged part today for further evaluation of a left index finger laceration that occurred just prior to arrival.  Patient was cutting some chicken when she excellently sliced her finger.  Patient states that her tetanus is not up-to-date.   HPI     Prior to Admission medications   Medication Sig Start Date End Date Taking? Authorizing Provider  albuterol  (VENTOLIN  HFA) 108 (90 Base) MCG/ACT inhaler Inhale 1-2 puffs into the lungs every 6 (six) hours as needed for wheezing or shortness of breath. 02/10/23   White, Elizabeth A, PA-C  amLODipine  (NORVASC ) 5 MG tablet Take 1 tablet (5 mg total) by mouth daily. 04/27/22     azithromycin  (ZITHROMAX ) 250 MG tablet Take 1 tablet (250 mg total) by mouth daily. Take first 2 tablets together, then 1 every day until finished. 02/10/23   White, Elizabeth A, PA-C  clonazePAM (KLONOPIN) 0.5 MG tablet Take 0.5 mg by mouth 3 (three) times daily as needed for anxiety.    [provider]  nirmatrelvir  & ritonavir  (PAXLOVID , 300/100,) 20 x 150 MG & 10 x 100MG  TBPK Take 3 tablets twice a day by mouth for 5 days. 04/27/22     nortriptyline  (PAMELOR ) 25 MG capsule Take 1 capsule (25 mg total) by mouth at bedtime. Patient not taking: Reported on 10/02/2018 05/29/11 10/02/27  Cleatrice Ludie SAUNDERS, MD  Semaglutide -Weight Management (WEGOVY ) 0.25 MG/0.5ML SOAJ Inject 0.25 mg every week by subcutaneous route. 10/24/21     Semaglutide -Weight Management (WEGOVY ) 0.25 MG/0.5ML SOAJ Inject 0.25 mg into the skin once weekly 04/10/22     sucralfate  (CARAFATE ) 1 g tablet Take 1 tablet (1 g total) by mouth 4 (four) times daily as needed. 12/08/19   Theadore Ozell CHRISTELLA, MD    Allergies: Patient has no known allergies.    Review of Systems  All  other systems reviewed and are negative.   Updated Vital Signs BP (!) 165/108 (BP Location: Right Arm)   Pulse 93   Temp 98.1 F (36.7 C) (Oral)   Resp 18   SpO2 93%   Physical Exam Vitals and nursing note reviewed.  Constitutional:      Appearance: Normal appearance.  HENT:     Head: Normocephalic and atraumatic.  Eyes:     General:        Right eye: No discharge.        Left eye: No discharge.     Conjunctiva/sclera: Conjunctivae normal.  Pulmonary:     Effort: Pulmonary effort is normal.  Musculoskeletal:     Comments: Good strength with flexion and extension in the finger.  Good cap refill.  Skin:    General: Skin is warm and dry.     Findings: No rash.     Comments: U-shaped laceration approximately 2 cm to the dorsal aspect of the left index finger over the PIP.  Neurological:     General: No focal deficit present.     Mental Status: She is alert.  Psychiatric:        Mood and Affect: Mood normal.        Behavior: Behavior normal.     (all labs ordered are listed, but only abnormal results are displayed) Labs Reviewed - No data to display  EKG: None  Radiology: No results found.   .Laceration Repair  Date/Time: 10/07/2023 2:20 PM  Performed by: Theotis Cameron HERO, PA-C Authorized by: Theotis Cameron HERO, PA-C   Consent:    Consent obtained:  Verbal   Consent given by:  Patient   Risks discussed:  Infection   Alternatives discussed:  No treatment Universal protocol:    Procedure explained and questions answered to patient or proxy's satisfaction: yes     Relevant documents present and verified: yes     Test results available: yes     Imaging studies available: yes     Required blood products, implants, devices, and special equipment available: yes     Site/side marked: yes     Immediately prior to procedure, a time out was called: yes     Patient identity confirmed:  Verbally with patient and arm band Anesthesia:    Anesthesia method:  Local  infiltration   Local anesthetic:  Lidocaine  1% w/o epi Laceration details:    Location:  Finger   Finger location:  L index finger   Length (cm):  2 Pre-procedure details:    Preparation:  Patient was prepped and draped in usual sterile fashion Exploration:    Hemostasis achieved with:  Direct pressure   Imaging outcome: foreign body not noted     Wound exploration: wound explored through full range of motion     Wound extent: areolar tissue not violated, fascia not violated, no foreign body, no signs of injury, no nerve damage, no tendon damage, no underlying fracture and no vascular damage     Contaminated: no   Treatment:    Area cleansed with:  Povidone-iodine   Amount of cleaning:  Standard   Visualized foreign bodies/material removed: no     Debridement:  None   Undermining:  None   Scar revision: no   Skin repair:    Repair method:  Sutures   Suture size:  4-0   Suture material:  Prolene   Suture technique:  Simple interrupted   Number of sutures:  3 Approximation:    Approximation:  Close Repair type:    Repair type:  Simple Post-procedure details:    Dressing:  Open (no dressing)   Procedure completion:  Tolerated well, no immediate complications    Medications Ordered in the ED  Tdap (BOOSTRIX ) injection 0.5 mL (0.5 mLs Intramuscular Given 10/07/23 1326)  lidocaine  (PF) (XYLOCAINE ) 1 % injection 10 mL (10 mLs Other Given by Other 10/07/23 1326)      Medical Decision Making [facility] Larson is a 58 y.o. female patient who presents to the emergency department today for further evaluation of a finger laceration.  Tdap was updated today.  Offered patient x-ray which she declined.  I think this is appropriate this is a very superficial laceration.  This was irrigated and cleaned.  Visual inspection did not reveal any obvious foreign bodies.  I closed the wound with sutures.  Please see procedure note for detail.  They will come out in 7 to 10 days.  She will either return  here or go to her PCP.  Strict turn precautions were discussed.  She is safe for discharge.   Risk Prescription drug management.     Final diagnoses:  Laceration of left index finger without damage to nail, foreign body presence unspecified, initial encounter    ED Discharge Orders     None          Theotis Cameron HERO, PA-C  10/07/23 1423    Laurice Maude BROCKS, MD 10/07/23 1600

## 2023-10-07 NOTE — ED Triage Notes (Signed)
 Pt cut her left index finger while cutting chicken. Needs updated tetanus

## 2023-10-07 NOTE — Discharge Instructions (Signed)
 Sutures need to come out in 7 to 10 days.  As we discussed, you can clean the area.  If you are going to be outside I would keep it covered.  Return for any worsening symptoms like we discussed.  Ibuprofen for pain control.

## 2024-04-18 ENCOUNTER — Ambulatory Visit

## 2024-04-18 VITALS — Ht 67.0 in | Wt 244.0 lb

## 2024-04-18 DIAGNOSIS — Z1211 Encounter for screening for malignant neoplasm of colon: Secondary | ICD-10-CM

## 2024-04-18 MED ORDER — NA SULFATE-K SULFATE-MG SULF 17.5-3.13-1.6 GM/177ML PO SOLN: 1.0000 | Freq: Once | ORAL | 0 refills | Status: AC

## 2024-04-18 NOTE — Progress Notes (Signed)
 PCP MD at time of PV: Ilah, MD __________________________________________________________________________________________________________________________________________  No egg allergy known to patient  No soy allergy known to patient No issues known to pt with past sedation with any surgeries or procedures (Hives after knee replacement, not sure if from anesthesia or the pain medications)  Patient denies ever being told they had issues or difficulty with intubation  No FH of Malignant Hyperthermia Pt is on diet pills - Phentermine  Pt is not on  home 02  Pt is not on blood thinners  No A fib or A flutter Have any cardiac testing pending--no  LOA: independent  No Chew or Snuff tobacco __________________________________________________________________________________________________________________________________________  Constipation: drug induced with phentermine  Prep: suprep  __________________________________________________________________________________________________________________________________________  PV completed with patient. Prep instructions reviewed and provided during apt. Rx sent to preferred pharmacy.  __________________________________________________________________________________________________________________________________________  Patient's chart reviewed by Norleen Schillings CNRA prior to previsit and patient appropriate for the [facility].  Previsit completed and red dot placed by patient's name on their procedure day (on provider's schedule).

## 2024-04-21 ENCOUNTER — Encounter: Payer: Self-pay | Admitting: Internal Medicine

## 2024-05-02 ENCOUNTER — Ambulatory Visit: Admitting: Internal Medicine

## 2024-05-02 ENCOUNTER — Encounter: Payer: Self-pay | Admitting: Internal Medicine

## 2024-05-02 VITALS — BP 126/68 | HR 63 | Temp 98.1°F | Resp 16 | Ht 67.0 in | Wt 244.0 lb

## 2024-05-02 DIAGNOSIS — Z1211 Encounter for screening for malignant neoplasm of colon: Secondary | ICD-10-CM

## 2024-05-02 DIAGNOSIS — D123 Benign neoplasm of transverse colon: Secondary | ICD-10-CM

## 2024-05-02 DIAGNOSIS — D122 Benign neoplasm of ascending colon: Secondary | ICD-10-CM

## 2024-05-02 MED ORDER — SODIUM CHLORIDE 0.9 % IV SOLN: 500.0000 mL | Freq: Once | INTRAVENOUS | Status: DC

## 2024-05-02 NOTE — Progress Notes (Signed)
 Pt's states no medical or surgical changes since previsit or office visit.

## 2024-05-02 NOTE — Progress Notes (Signed)
 Called to room to assist during endoscopic procedure.  Patient ID and intended procedure confirmed with present staff. Received instructions for my participation in the procedure from the performing physician.

## 2024-05-02 NOTE — Progress Notes (Signed)
 Vss nad trans to pacu

## 2024-05-02 NOTE — Progress Notes (Signed)
 "   GASTROENTEROLOGY PROCEDURE H&P NOTE   Primary Care Physician: Ilah Crigler, MD    Reason for Procedure:   Colon cancer screening  Plan:    Colonoscopy  Patient is appropriate for endoscopic procedure(s) in the ambulatory ([facility]) setting.  The nature of the procedure, as well as the risks, benefits, and alternatives were carefully and thoroughly reviewed with the patient. Ample time for discussion and questions allowed. The patient understood, was satisfied, and agreed to proceed.     HPI: Gabriella Larson is a 59 y.o. female who presents for colonoscopy for colon cancer screening. Denies blood in stools, changes in bowel habits, or unintentional weight loss. Denies family history of colon cancer. She has has one colonoscopy over 10 years ago that was reportedly normal.  Past Medical History:  Diagnosis Date   Spinal stenosis     Past Surgical History:  Procedure Laterality Date   ABDOMINAL HYSTERECTOMY     REPLACEMENT TOTAL KNEE Right    SHOULDER SURGERY  2010   right rotator cuff    Prior to Admission medications  Medication Sig Start Date End Date Taking? Authorizing Provider  amLODipine -valsartan (EXFORGE) 10-160 MG tablet Take 1 tablet by mouth daily. 03/17/24  Yes [provider]  clonazePAM (KLONOPIN) 1 MG disintegrating tablet Take 1 mg by mouth 3 (three) times daily. 01/16/24  Yes [provider]  albuterol  (VENTOLIN  HFA) 108 (90 Base) MCG/ACT inhaler Inhale 1-2 puffs into the lungs every 6 (six) hours as needed for wheezing or shortness of breath. 02/10/23   White, Elizabeth A, PA-C  methocarbamol (ROBAXIN) 500 MG tablet Take 500 mg by mouth every 8 (eight) hours as needed. 07/28/20   [provider]  phentermine (ADIPEX-P) 37.5 MG tablet Take 37.5 mg by mouth daily. 04/10/24   [provider]  sucralfate  (CARAFATE ) 1 g tablet Take 1 tablet (1 g total) by mouth 4 (four) times daily as needed. Patient not taking: Reported on 05/02/2024  12/08/19   Theadore Ozell CHRISTELLA, MD    Current Outpatient Medications  Medication Sig Dispense Refill   amLODipine -valsartan (EXFORGE) 10-160 MG tablet Take 1 tablet by mouth daily.     clonazePAM (KLONOPIN) 1 MG disintegrating tablet Take 1 mg by mouth 3 (three) times daily.     albuterol  (VENTOLIN  HFA) 108 (90 Base) MCG/ACT inhaler Inhale 1-2 puffs into the lungs every 6 (six) hours as needed for wheezing or shortness of breath. 1 each 0   methocarbamol (ROBAXIN) 500 MG tablet Take 500 mg by mouth every 8 (eight) hours as needed.     phentermine (ADIPEX-P) 37.5 MG tablet Take 37.5 mg by mouth daily.     sucralfate  (CARAFATE ) 1 g tablet Take 1 tablet (1 g total) by mouth 4 (four) times daily as needed. (Patient not taking: Reported on 05/02/2024) 30 tablet 0   Current Facility-Administered Medications  Medication Dose Route Frequency Provider Last Rate Last Admin   0.9 %  sodium chloride  infusion  500 mL Intravenous Once Federico Rosario BROCKS, MD        Allergies as of 05/02/2024 - Review Complete 05/02/2024  Allergen Reaction Noted   Acetaminophen  Hives 10/02/2018   Ibuprofen Hives 10/02/2018   Naproxen Hives 10/02/2018    Family History  Problem Relation Age of Onset   Diabetes Mother    Hyperlipidemia Mother    Hypertension Mother    Heart attack Neg Hx    Sudden death Neg Hx    Colon cancer Neg Hx  Rectal cancer Neg Hx    Stomach cancer Neg Hx     Social History   Socioeconomic History   Marital status: Single    Spouse name: Not on file   Number of children: Not on file   Years of education: Not on file   Highest education level: Not on file  Occupational History   Not on file  Tobacco Use   Smoking status: Never   Smokeless tobacco: Never  Vaping Use   Vaping status: Never Used  Substance and Sexual Activity   Alcohol use: Yes    Comment: occ   Drug use: Never   Sexual activity: Not on file  Other Topics Concern   Not on file  Social History Narrative   Not on  file   Social Drivers of Health   Tobacco Use: Low Risk (05/02/2024)   Patient History    Smoking Tobacco Use: Never    Smokeless Tobacco Use: Never    Passive Exposure: Not on file  Financial Resource Strain: Not on file  Food Insecurity: No Food Insecurity (07/22/2021)   Received from [facility]   Epic    Within the past 12 months, you worried that your food would run out before you got the money to buy more.: Never true    Within the past 12 months, the food you bought just didn't last and you didn't have money to get more.: Never true  Transportation Needs: Not on file  Physical Activity: Not on file  Stress: Not on file  Social Connections: Not on file  Intimate Partner Violence: Not on file  Depression (PHQ2-9): Not on file  Alcohol Screen: Not on file  Housing: Not on file  Utilities: Not on file  Health Literacy: Not on file    Physical Exam: Vital signs in last 24 hours: BP (!) 157/86   Pulse 80   Temp 98.1 F (36.7 C) (Temporal)   Ht 5' 7 (1.702 m)   Wt 244 lb (110.7 kg)   SpO2 98%   BMI 38.22 kg/m  GEN: NAD EYE: Sclerae anicteric ENT: MMM CV: Non-tachycardic Pulm: No increased work of breathing GI: Soft, NT/ND NEURO:  Alert & Oriented   Estefana Kidney, MD [facility]  05/02/2024 8:33 AM  "

## 2024-05-02 NOTE — Patient Instructions (Signed)
-   Discharge patient to home (with escort). - Await pathology results.   YOU HAD AN ENDOSCOPIC PROCEDURE TODAY AT THE [facility]:   Refer to the procedure report that was given to you for any specific questions about what was found during the examination.  If the procedure report does not answer your questions, please call your gastroenterologist to clarify.  If you requested that your care partner not be given the details of your procedure findings, then the procedure report has been included in a sealed envelope for you to review at your convenience later.  YOU SHOULD EXPECT: Some feelings of bloating in the abdomen. Passage of more gas than usual.  Walking can help get rid of the air that was put into your GI tract during the procedure and reduce the bloating. If you had a lower endoscopy (such as a colonoscopy or flexible sigmoidoscopy) you may notice spotting of blood in your stool or on the toilet paper. If you underwent a bowel prep for your procedure, you may not have a normal bowel movement for a few days.  Please Note:  You might notice some irritation and congestion in your nose or some drainage.  This is from the oxygen used during your procedure.  There is no need for concern and it should clear up in a day or so.  SYMPTOMS TO REPORT IMMEDIATELY:  Following lower endoscopy (colonoscopy or flexible sigmoidoscopy):  Excessive amounts of blood in the stool  Significant tenderness or worsening of abdominal pains  Swelling of the abdomen that is new, acute  Fever of 100F or higher  For urgent or emergent issues, a gastroenterologist can be reached at any hour by calling [phone]. Do not use MyChart messaging for urgent concerns.    DIET:  We do recommend a small meal at first, but then you may proceed to your regular diet.  Drink plenty of fluids but you should avoid alcoholic beverages for 24 hours.  ACTIVITY:  You should plan to take it easy for the rest of today  and you should NOT DRIVE or use heavy machinery until tomorrow (because of the sedation medicines used during the test).    FOLLOW UP: Our staff will call the number listed on your records the next business day following your procedure.  We will call around 7:15- 8:00 am to check on you and address any questions or concerns that you may have regarding the information given to you following your procedure. If we do not reach you, we will leave a message.     If any biopsies were taken you will be contacted by phone or by letter within the next 1-3 weeks.  Please call us  at [phone] if you have not heard about the biopsies in 3 weeks.    SIGNATURES/CONFIDENTIALITY: You and/or your care partner have signed paperwork which will be entered into your electronic medical record.  These signatures attest to the fact that that the information above on your After Visit Summary has been reviewed and is understood.  Full responsibility of the confidentiality of this discharge information lies with you and/or your care-partner.

## 2024-05-02 NOTE — Op Note (Signed)
 [facility] Patient Name: Gabriella Larson Procedure Date: 05/02/2024 8:26 AM MRN: [id number] Endoscopist: Rosario Estefana Kidney , , [id number] Age: 59 Referring MD:  Date of Birth: Jul 03, 1965 Gender: Female Account #: [id number] Procedure:                Colonoscopy Indications:              Screening for colorectal malignant neoplasm Medicines:                Monitored Anesthesia Care Procedure:                Pre-Anesthesia Assessment:                           - Prior to the procedure, a History and Physical                            was performed, and patient medications and                            allergies were reviewed. The patient's tolerance of                            previous anesthesia was also reviewed. The risks                            and benefits of the procedure and the sedation                            options and risks were discussed with the patient.                            All questions were answered, and informed consent                            was obtained. Prior Anticoagulants: The patient has                            taken no anticoagulant or antiplatelet agents. ASA                            Grade Assessment: III - A patient with severe                            systemic disease. After reviewing the risks and                            benefits, the patient was deemed in satisfactory                            condition to undergo the procedure.                           After obtaining informed consent, the colonoscope  was passed under direct vision. Throughout the                            procedure, the patient's blood pressure, pulse, and                            oxygen saturations were monitored continuously. The                            CF [id number] #[id number] was introduced through the anus                            and advanced to the the terminal ileum. The                            colonoscopy  was performed without difficulty. The                            patient tolerated the procedure well. The quality                            of the bowel preparation was good. The terminal                            ileum, ileocecal valve, appendiceal orifice, and                            rectum were photographed. Scope In: 8:41:37 AM Scope Out: 8:59:44 AM Scope Withdrawal Time: 0 hours 12 minutes 35 seconds  Total Procedure Duration: 0 hours 18 minutes 7 seconds  Findings:                 The terminal ileum appeared normal.                           Four sessile polyps were found in the transverse                            colon and ascending colon. The polyps were 3 to 5                            mm in size. These polyps were removed with a cold                            snare. Resection and retrieval were complete.                           Multiple diverticula were found in the sigmoid                            colon.                           Non-bleeding internal hemorrhoids were found during  retroflexion. Complications:            No immediate complications. Estimated Blood Loss:     Estimated blood loss was minimal. Impression:               - The examined portion of the ileum was normal.                           - Four 3 to 5 mm polyps in the transverse colon and                            in the ascending colon, removed with a cold snare.                            Resected and retrieved.                           - Diverticulosis in the sigmoid colon.                           - Non-bleeding internal hemorrhoids. Recommendation:           - Discharge patient to home (with escort).                           - Await pathology results.                           - The findings and recommendations were discussed                            with the patient. Dr Estefana Federico Rosario Estefana Federico,  05/02/2024 9:03:43 AM
